# Patient Record
Sex: Female | Born: 1990
Health system: Southern US, Community
[De-identification: ages and names within clinical notes are randomized; demographics above are authoritative.]

## PROBLEM LIST (undated history)

## (undated) ENCOUNTER — Inpatient Hospital Stay: Payer: Self-pay

## (undated) DIAGNOSIS — R87619 Unspecified abnormal cytological findings in specimens from cervix uteri: Secondary | ICD-10-CM

## (undated) DIAGNOSIS — Z9289 Personal history of other medical treatment: Secondary | ICD-10-CM

## (undated) DIAGNOSIS — R12 Heartburn: Secondary | ICD-10-CM

## (undated) HISTORY — PX: TYMPANOSTOMY TUBE PLACEMENT: SHX32

## (undated) HISTORY — DX: Heartburn: R12

## (undated) HISTORY — DX: Personal history of other medical treatment: Z92.89

## (undated) HISTORY — PX: WISDOM TOOTH EXTRACTION: SHX21

## (undated) HISTORY — DX: Unspecified abnormal cytological findings in specimens from cervix uteri: R87.619

---
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
## 2012-06-14 ENCOUNTER — Emergency Department: Payer: Self-pay | Admitting: *Deleted
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
## 2013-12-04 DIAGNOSIS — R87619 Unspecified abnormal cytological findings in specimens from cervix uteri: Secondary | ICD-10-CM

## 2013-12-04 HISTORY — DX: Unspecified abnormal cytological findings in specimens from cervix uteri: R87.619

## 2014-06-21 ENCOUNTER — Inpatient Hospital Stay: Payer: Self-pay | Admitting: Obstetrics & Gynecology

## 2014-06-21 LAB — CBC WITH DIFFERENTIAL/PLATELET
BASOS ABS: 0.1 10*3/uL (ref 0.0–0.1)
Basophil %: 0.5 %
EOS PCT: 0.5 %
Eosinophil #: 0.1 10*3/uL (ref 0.0–0.7)
HCT: 35.4 % (ref 35.0–47.0)
HGB: 11.1 g/dL — ABNORMAL LOW (ref 12.0–16.0)
LYMPHS ABS: 1.8 10*3/uL (ref 1.0–3.6)
Lymphocyte %: 11.5 %
MCH: 26.5 pg (ref 26.0–34.0)
MCHC: 31.5 g/dL — AB (ref 32.0–36.0)
MCV: 84 fL (ref 80–100)
Monocyte #: 0.8 x10 3/mm (ref 0.2–0.9)
Monocyte %: 5.1 %
NEUTROS ABS: 12.9 10*3/uL — AB (ref 1.4–6.5)
NEUTROS PCT: 82.4 %
Platelet: 258 10*3/uL (ref 150–440)
RBC: 4.2 10*6/uL (ref 3.80–5.20)
RDW: 15.9 % — ABNORMAL HIGH (ref 11.5–14.5)
WBC: 15.6 10*3/uL — ABNORMAL HIGH (ref 3.6–11.0)

## 2014-06-21 LAB — GC/CHLAMYDIA PROBE AMP

## 2014-06-23 LAB — HEMATOCRIT: HCT: 28.3 % — AB (ref 35.0–47.0)

## 2015-03-09 NOTE — H&P (Signed)
L&D Evaluation:  History:  HPI CC: worsening UCs HPI: 24 y/o G1 @ 40/5 (LMP=17wk u/s), with above CC. Preg c/b GBS pos and ASCUS/HPV+ pap  No VB, LOF or decreased FM   Patient's Medical History No Chronic Illness   Patient's Surgical History none   Medications Pre Natal Vitamins   Allergies NKDA   Social History none   Family History Non-Contributory   Exam:  Vital Signs AF VS normal and stable   General no apparent distress   Mental Status clear   Chest CTAB   Heart RRR no MRGs   Abdomen gravid, non-tender   Estimated Fetal Weight Average for gestational age, 713700gm   Fetal Position cephalic   Pelvic 4/100 per RN   Mebranes Intact   FHT 145 baseline, +accels, no decels, mod var   Ucx q2-245m   Impression:  Impression likely transitioning to active labor   Plan:  Comments *IUP: category I tracing *term labor: likely transitioning to active labor. okay for intermittent monitoring. no need to augment and if need to, will wait until 2nd dose of abx is done. AROM vs pit d/w patient and she is amenable to either *GBS pos: will start amp *Analgesia: we discussed the various methods and patient to consider *Abnormal pap: rpt pap with co-testing in one year  O pos/RI/rpr pending/hiv neg/hepB neg/tdap UTD/ascus and HPV pos pap (2014)/varicella NI   Electronic Signatures: [place] BingPickens, Zeric Baranowski (MD)  (Signed 23-Aug-15 10:37)  Authored: L&D Evaluation   Last Updated: 23-Aug-15 10:37 by [place] BingPickens, Petrea Fredenburg (MD)

## 2015-11-15 LAB — HM PAP SMEAR: HM Pap smear: NEGATIVE

## 2017-05-08 ENCOUNTER — Ambulatory Visit: Payer: Self-pay | Admitting: Obstetrics and Gynecology

## 2017-07-10 ENCOUNTER — Ambulatory Visit: Payer: Self-pay | Admitting: Obstetrics and Gynecology

## 2017-10-30 NOTE — L&D Delivery Note (Signed)
Date of delivery: 06/22/2018 Estimated Date of Delivery: 06/25/18 Patient's last menstrual period was 09/11/2017. EGA: 2353w4d  Delivery Note At 3:07 PM a viable female was delivered via Vaginal, Spontaneous (Presentation: OA;  ROA with nuchal hand).  APGAR: 8, 9; weight 7 lb 6.2 oz (3350 g).   Placenta status: spontaneous, intact.  Cord:  with the following complications: none.  Cord pH: NA  Mom progressed well following 1 dose of cytotec given 25 mcg PV and 25 mcg buccal. She had SROM at 2:05 PM and was complete and pushing at 3:03 PM. She pushed to deliver a viable female infant at 3:07 PM.  The head followed by shoulders, which delivered without difficulty, and the rest of the body.  No nuchal cord noted.  Baby to mom's chest.  Cord clamped and cut after 4 min delay.  Cord blood obtained.  Placenta delivered spontaneously, intact, with a 3-vessel cord.  No lacerations.  All counts correct.  Hemostasis obtained with IV pitocin and fundal massage.     Anesthesia:  none Episiotomy: None Lacerations:  none Suture Repair: NA Est. Blood Loss (mL): 320  Mom to postpartum.  Baby to Couplet care / Skin to Skin.  Carolyn Wheeler, CNM 06/22/2018, 6:22 PM

## 2017-10-31 ENCOUNTER — Ambulatory Visit (INDEPENDENT_AMBULATORY_CARE_PROVIDER_SITE_OTHER): Payer: BLUE CROSS/BLUE SHIELD | Admitting: Obstetrics and Gynecology

## 2017-10-31 ENCOUNTER — Encounter: Payer: Self-pay | Admitting: Obstetrics and Gynecology

## 2017-10-31 VITALS — BP 118/70 | Wt 173.0 lb

## 2017-10-31 DIAGNOSIS — Z3480 Encounter for supervision of other normal pregnancy, unspecified trimester: Secondary | ICD-10-CM

## 2017-10-31 DIAGNOSIS — Z124 Encounter for screening for malignant neoplasm of cervix: Secondary | ICD-10-CM

## 2017-10-31 DIAGNOSIS — Z3A01 Less than 8 weeks gestation of pregnancy: Secondary | ICD-10-CM

## 2017-10-31 DIAGNOSIS — Z113 Encounter for screening for infections with a predominantly sexual mode of transmission: Secondary | ICD-10-CM

## 2017-10-31 LAB — OB RESULTS CONSOLE VARICELLA ZOSTER ANTIBODY, IGG: VARICELLA IGG: IMMUNE

## 2017-10-31 NOTE — Progress Notes (Signed)
New Obstetric Patient H&P   Chief Complaint: "Desires prenatal care"   History of Present Illness: Patient is a 27 y.o. G2P1001 Hispanic or Latino female, LMP 09/11/17 presents with amenorrhea and positive home pregnancy test. Based on her  LMP, her EDD is Estimated Date of Delivery: 06/18/18 and her EGA is 3741w1d. Cycles are 4. days, regular, and occur approximately every : 28 days. Her last pap smear was 2 years ago and was no abnormalities.    She had a urine pregnancy test which was positive 2 week(s)  ago. Her last menstrual period was normal and lasted for  4 day(s). Since her LMP she claims she has experienced nausea and vomiting and some mild dizzyness with eating. She denies vaginal bleeding. Her past medical history is noncontributory. Her prior pregnancies are notable for no complications.   Since her LMP, she admits to the use of tobacco products  no She claims she has gained zero pounds since the start of her pregnancy.  There are cats in the home in the home  no  She admits close contact with children on a regular basis  yes  She has had chicken pox in the past no She has had Tuberculosis exposures, symptoms, or previously tested positive for TB   no Current or past history of domestic violence. no  Genetic Screening/Teratology Counseling: (Includes patient, baby's father, or anyone in either family with:)   1. Patient's [facility]/= 2735 [facility]  no 2. Thalassemia ([place], [place], Mediterranean, or Asian background): MCV<80  no 3. Neural tube defect (meningomyelocele, spina bifida, anencephaly)  no 4. Congenital heart defect  no  5. Down syndrome  no 6. Tay-Sachs (Jewish, [place])  no 7. Canavan's Disease  no 8. Sickle cell disease or trait (African)  no  9. Hemophilia or other blood disorders  no  10. Muscular dystrophy  no  11. Cystic fibrosis  no  12. Huntington's Chorea  no  13. Mental retardation/autism  no 14. Other inherited genetic or chromosomal disorder   no 15. Maternal metabolic disorder (DM, PKU, etc)  no 16. Patient or FOB with a child with a birth defect not listed above no  16a. Patient or FOB with a birth defect themselves no 17. Recurrent pregnancy loss, or stillbirth  no  18. Any medications since LMP other than prenatal vitamins (include vitamins, supplements, OTC meds, drugs, alcohol)  no 19. Any other genetic/environmental exposure to discuss  no  Infection History:   1. Lives with someone with TB or TB exposed  no  2. Patient or partner has history of genital herpes  no 3. Rash or viral illness since LMP  no 4. History of STI (GC, CT, HPV, syphilis, HIV)  no 5. History of recent travel :  no  Other pertinent information:  no   Review of Systems:10 point review of systems negative unless otherwise noted in HPI  Past Medical History:  Diagnosis Date  . Abnormal Pap smear of cervix 12/04/2013   ascus - pos hpv  . History of Papanicolaou smear of cervix 12/04/2013; [id number]   ascus hpv +; neg -/-/-    Past Surgical History:  Procedure Laterality Date  . TYMPANOSTOMY TUBE PLACEMENT      Gynecologic History: Patient's last menstrual period was 09/11/2017.  Obstetric History: G2P1001, SVD without complication  History reviewed. No pertinent family history.  Social History   Socioeconomic History  . Marital status: Married    Spouse name: Not on file  .  Number of children: Not on file  . Years of education: 59  . Highest education level: Not on file  Social Needs  . Financial resource strain: Not on file  . Food insecurity - worry: Not on file  . Food insecurity - inability: Not on file  . Transportation needs - medical: Not on file  . Transportation needs - non-medical: Not on file  Occupational History  . Not on file  Tobacco Use  . Smoking status: Never Smoker  . Smokeless tobacco: Never Used  Substance and Sexual Activity  . Alcohol use: No    Frequency: Never    Comment: former  . Drug use: No   . Sexual activity: Yes    Birth control/protection: None  Other Topics Concern  . Not on file  Social History Narrative  . Not on file   Allergies: No Known Allergies  Prior to Admission medications   Medication Sig Start Date End Date Taking? Authorizing Provider  Prenatal Vit-Fe Fumarate-FA (MULTIVITAMIN-PRENATAL) 27-0.8 MG TABS tablet Take 1 tablet by mouth daily at 12 noon.   Yes [provider]    Physical Exam BP 118/70   Wt 173 lb (78.5 kg)   LMP 09/11/2017   BMI 29.70 kg/m   Physical Exam  Constitutional: She is oriented to person, place, and time. She appears well-developed and well-nourished. No distress.  HENT:  Head: Normocephalic and atraumatic.  Eyes: Conjunctivae are normal.  Neck: Normal range of motion. Neck supple. No thyromegaly present.  Cardiovascular: Normal rate, regular rhythm and normal heart sounds. Exam reveals no gallop and no friction rub.  No murmur heard. Pulmonary/Chest: Effort normal and breath sounds normal. She has no wheezes.  Abdominal: Soft. She exhibits no distension and no mass. There is no tenderness. There is no rebound and no guarding. No hernia. Hernia confirmed negative in the right inguinal area and confirmed negative in the left inguinal area.  Genitourinary: Vagina normal and uterus normal. Pelvic exam was performed with patient supine. There is no rash, tenderness or lesion on the right labia. There is no rash, tenderness or lesion on the left labia. Uterus is not tender. Cervix exhibits no motion tenderness and no discharge. Right adnexum displays no mass, no tenderness and no fullness. Left adnexum displays no mass, no tenderness and no fullness.  Musculoskeletal: Normal range of motion.  Lymphadenopathy:       Right: No inguinal adenopathy present.       Left: No inguinal adenopathy present.  Neurological: She is alert and oriented to person, place, and time.  Skin: Skin is warm and dry. No rash noted.  Psychiatric:  She has a normal mood and affect. Her behavior is normal.    Female Chaperone present during breast and/or pelvic exam.   Assessment: 27 y.o. G2P1001 at [redacted]w[redacted]d presenting to initiate prenatal care  Plan: 1) Avoid alcoholic beverages. 2) Patient encouraged not to smoke.  3) Discontinue the use of all non-medicinal drugs and chemicals.  4) Take prenatal vitamins daily.  5) Nutrition, food safety (fish, cheese advisories, and high nitrite foods) and exercise discussed. 6) Hospital and practice style discussed with cross coverage system.  7) Genetic Screening, such as with 1st Trimester Screening, cell free fetal DNA, AFP testing, and Ultrasound, as well as with amniocentesis and CVS as appropriate, is discussed with patient. At the conclusion of today's visit patient declined genetic testing 8) Patient is asked about travel to areas at risk for the Zika virus, and counseled to  avoid travel and exposure to mosquitoes or sexual partners who may have themselves been exposed to the virus. Testing is discussed, and will be ordered as appropriate.   Thomasene Mohair, MD 10/31/2017 10:41 AM

## 2017-10-31 NOTE — Patient Instructions (Signed)
Folic Acid (Folate): 800 - 1,6101,000 mcg or 1 mg

## 2017-11-02 LAB — RPR+RH+ABO+RUB AB+AB SCR+CB...
ANTIBODY SCREEN: NEGATIVE
HEMATOCRIT: 41.1 % (ref 34.0–46.6)
HEMOGLOBIN: 13.1 g/dL (ref 11.1–15.9)
HIV SCREEN 4TH GENERATION: NONREACTIVE
Hepatitis B Surface Ag: NEGATIVE
MCH: 28 pg (ref 26.6–33.0)
MCHC: 31.9 g/dL (ref 31.5–35.7)
MCV: 88 fL (ref 79–97)
Platelets: 393 10*3/uL — ABNORMAL HIGH (ref 150–379)
RBC: 4.68 x10E6/uL (ref 3.77–5.28)
RDW: 14.5 % (ref 12.3–15.4)
RPR: NONREACTIVE
Rh Factor: POSITIVE
Rubella Antibodies, IGG: 1.44 index (ref >0.99)
Varicella zoster IgG: 2712 index (ref >165)
WBC: 12.2 10*3/uL — AB (ref 3.4–10.8)

## 2017-11-02 LAB — HEMOGLOBINOPATHY EVALUATION
HGB A: 98 % (ref 96.4–98.8)
HGB C: 0 %
HGB S: 0 %
HGB VARIANT: 0 %
Hemoglobin A2 Quantitation: 2 % (ref 1.8–3.2)
Hemoglobin F Quantitation: 0 % (ref 0.0–2.0)

## 2017-11-02 LAB — IGP, CTNG, RFX APTIMA HPV ASCU
CHLAMYDIA, NUC. ACID AMP: NEGATIVE
GONOCOCCUS BY NUCLEIC ACID AMP: NEGATIVE
PAP Smear Comment: 0

## 2017-11-07 ENCOUNTER — Ambulatory Visit (INDEPENDENT_AMBULATORY_CARE_PROVIDER_SITE_OTHER): Payer: BLUE CROSS/BLUE SHIELD | Admitting: Obstetrics & Gynecology

## 2017-11-07 ENCOUNTER — Ambulatory Visit (INDEPENDENT_AMBULATORY_CARE_PROVIDER_SITE_OTHER): Payer: BLUE CROSS/BLUE SHIELD

## 2017-11-07 VITALS — BP 110/60 | Wt 176.0 lb

## 2017-11-07 DIAGNOSIS — Z3A01 Less than 8 weeks gestation of pregnancy: Secondary | ICD-10-CM

## 2017-11-07 DIAGNOSIS — Z3480 Encounter for supervision of other normal pregnancy, unspecified trimester: Secondary | ICD-10-CM

## 2017-11-07 DIAGNOSIS — Z362 Encounter for other antenatal screening follow-up: Secondary | ICD-10-CM

## 2017-11-07 NOTE — Progress Notes (Signed)
Review of ULTRASOUND.    [facility] personally [facility] images and [facility]    Plan of management to be discussed with patient.  EDC changed. Labs discussed. U Culture sent today Nausea mild, controlled w Carolyn DeistBonjesta (she takes PRN) Declines genetic testing  [facility] Prenatal Labs  [facility] Blood type: O/Positive/-- (01/02 1045)   Genetic Screen declines Antibody:Negative (01/02 1045)  Anatomic US WS Rubella: 1.44 (01/02 1045) Varicella: Immune  GTT  Third trimester:  RPR: Non Reactive (01/02 1045)   Rhogam n/a HBsAg: Negative (01/02 1045)   TDaP vaccine                       Flu Shot: HIV: Non Reactive (01/02 1045)   Baby Food breast                    GBS:   Contraception unsure Pap: normal 11/07/17  CBB     CS/VBAC no   Support Person spouse    Annamarie MajorPaul Phelan Goers, MD, Merlinda FrederickFACOG [facility], [facility] 11/07/2017  2:20 PM

## 2017-11-09 LAB — URINE CULTURE

## 2017-12-05 ENCOUNTER — Ambulatory Visit (INDEPENDENT_AMBULATORY_CARE_PROVIDER_SITE_OTHER): Payer: BLUE CROSS/BLUE SHIELD | Admitting: Maternal Newborn

## 2017-12-05 ENCOUNTER — Encounter: Payer: Self-pay | Admitting: Maternal Newborn

## 2017-12-05 VITALS — BP 110/70 | Wt 169.0 lb

## 2017-12-05 DIAGNOSIS — Z3480 Encounter for supervision of other normal pregnancy, unspecified trimester: Secondary | ICD-10-CM

## 2017-12-05 DIAGNOSIS — Z3A11 11 weeks gestation of pregnancy: Secondary | ICD-10-CM

## 2017-12-05 NOTE — Progress Notes (Signed)
    Routine Prenatal Care Visit  Subjective  Carolyn QuailJeanette S Bishara is a 27 y.o. G2P1001 at 5453w1d being seen today for ongoing prenatal care.  She is currently monitored for the following issues for this low-risk pregnancy and has Supervision of other normal pregnancy, antepartum on their problem list.  ----------------------------------------------------------------------------------- Patient reports nausea and vomiting. She feels that it is decreasing, and has some days where she can eat normally. Also has ptyalism. Tolerating liquids every day. Bonjesta helps relieve n/v but makes her very drowsy. Vag. Bleeding: None.  Denies leaking of fluid.  ----------------------------------------------------------------------------------- The following portions of the patient's history were reviewed and updated as appropriate: allergies, current medications, past family history, past medical history, past social history, past surgical history and problem list. Problem list updated.   Objective  Blood pressure 110/70, weight 169 lb (76.7 kg), last menstrual period 09/11/2017. Pregravid weight 173 lb (78.5 kg) Total Weight Gain  (-1.814 kg) Urinalysis: Urine Protein: 1+ Urine Glucose: Negative  Fetal Status: Fetal Heart Rate (bpm): 164         General:  Alert, oriented and cooperative. Patient is in no acute distress.  Skin: Skin is warm and dry. No rash noted.   Cardiovascular: Normal heart rate noted  Respiratory: Normal respiratory effort, no problems with respiration noted  Abdomen: Soft, gravid, appropriate for gestational age. Pain/Pressure: Absent     Pelvic:  Cervical exam deferred        Extremities: Normal range of motion.     Mental Status: Normal mood and affect. Normal behavior. Normal judgment and thought content.     Assessment   27 y.o. G2P1001 at 3753w1d, EDD 06/25/2018 by Ultrasound presenting for routine prenatal visit.  Plan   pregnancy Problems (from 10/31/17 to present)    Problem Noted Resolved   Supervision of other normal pregnancy, antepartum 10/31/2017 by Conard NovakJackson, Stephen D, MD No   Overview Addendum 11/07/2017  2:23 PM by Nadara MustardHarris, Robert P, MD    [facility] Blood type: O/Positive/-- (01/02 1045)   Genetic Screen declines Antibody:Negative (01/02 1045)  Anatomic US  Rubella: 1.44 (01/02 1045) Varicella: Imm  GTT  Third trimester:  RPR: Non Reactive (01/02 1045)   Rhogam n/a HBsAg: Negative (01/02 1045)   TDaP vaccine                       Flu Shot: HIV: Non Reactive (01/02 1045)   Baby Food breast                    GBS:   Contraception unsure Pap: normal 11/07/17  CBB     CS/VBAC no   Support Person spouse          Discussed foods to try, strategies to relieve nausea.  Preterm labor symptoms and general obstetric precautions including but not limited to vaginal bleeding were reviewed in detail with the patient.  Return in about 4 weeks (around 01/02/2018) for ROB.  Marcelyn BruinsJacelyn Magic Mohler, CNM 12/06/2017  8:18 AM

## 2017-12-05 NOTE — Progress Notes (Signed)
C/o hard to eat.rj

## 2018-01-01 ENCOUNTER — Ambulatory Visit (INDEPENDENT_AMBULATORY_CARE_PROVIDER_SITE_OTHER): Payer: BLUE CROSS/BLUE SHIELD | Admitting: Maternal Newborn

## 2018-01-01 ENCOUNTER — Encounter: Payer: Self-pay | Admitting: Maternal Newborn

## 2018-01-01 VITALS — BP 100/60 | Wt 170.0 lb

## 2018-01-01 DIAGNOSIS — Z3A15 15 weeks gestation of pregnancy: Secondary | ICD-10-CM

## 2018-01-01 DIAGNOSIS — Z3689 Encounter for other specified antenatal screening: Secondary | ICD-10-CM

## 2018-01-01 DIAGNOSIS — Z3480 Encounter for supervision of other normal pregnancy, unspecified trimester: Secondary | ICD-10-CM

## 2018-01-01 NOTE — Progress Notes (Signed)
No concerns.rj 

## 2018-01-01 NOTE — Patient Instructions (Signed)

## 2018-01-01 NOTE — Progress Notes (Signed)
    Routine Prenatal Care Visit  Subjective  Leda QuailJeanette S Bottcher is a 27 y.o. G2P1001 at 8788w0d being seen today for ongoing prenatal care.  She is currently monitored for the following issues for this low-risk pregnancy and has Supervision of other normal pregnancy, antepartum on their problem list.  ----------------------------------------------------------------------------------- Patient reports no complaints.  Nausea has improved and she is tolerating all foods except for pork. Vag. Bleeding: None.  Movement: Present. Denies leaking of fluid.  ----------------------------------------------------------------------------------- The following portions of the patient's history were reviewed and updated as appropriate: allergies, current medications, past family history, past medical history, past social history, past surgical history and problem list. Problem list updated.   Objective  Blood pressure 100/60, weight 170 lb (77.1 kg), last menstrual period 09/11/2017. Pregravid weight 173 lb (78.5 kg) Total Weight Gain  (-1.361 kg) Urinalysis: Urine Protein: Trace Urine Glucose: Negative  Fetal Status: Fetal Heart Rate (bpm): 149   Movement: Present     General:  Alert, oriented and cooperative. Patient is in no acute distress.  Skin: Skin is warm and dry. No rash noted.   Cardiovascular: Normal heart rate noted  Respiratory: Normal respiratory effort, no problems with respiration noted  Abdomen: Soft, gravid, appropriate for gestational age. Pain/Pressure: Absent     Pelvic:  Cervical exam deferred        Extremities: Normal range of motion.     Mental Status: Normal mood and affect. Normal behavior. Normal judgment and thought content.     Assessment   27 y.o. G2P1001 at 4288w0d, EDD 06/25/2018 by Ultrasound presenting for routine prenatal visit.  Plan   pregnancy Problems (from 10/31/17 to present)    Problem Noted Resolved   Supervision of other normal pregnancy, antepartum  10/31/2017 by Conard NovakJackson, Stephen D, MD No   Overview Addendum 11/07/2017  2:23 PM by Nadara MustardHarris, Robert P, MD    [facility] Blood type: O/Positive/-- (01/02 1045)   Genetic Screen declines Antibody:Negative (01/02 1045)  Anatomic US  Rubella: 1.44 (01/02 1045) Varicella: Imm  GTT  Third trimester:  RPR: Non Reactive (01/02 1045)   Rhogam n/a HBsAg: Negative (01/02 1045)   TDaP vaccine                       Flu Shot: HIV: Non Reactive (01/02 1045)   Baby Food breast                    GBS:   Contraception unsure Pap: normal 11/07/17  CBB     CS/VBAC no   Support Person spouse           Preterm labor symptoms and general obstetric precautions were reviewed with the patient.  Please refer to After Visit Summary for other counseling recommendations.   Return in about 4 weeks (around 01/29/2018) for ROB following ultrasound.  Marcelyn BruinsJacelyn Schmid, CNM 01/01/2018  9:10 AM

## 2018-01-04 ENCOUNTER — Other Ambulatory Visit: Payer: Self-pay | Admitting: Obstetrics and Gynecology

## 2018-01-04 ENCOUNTER — Telehealth: Payer: Self-pay | Admitting: Obstetrics and Gynecology

## 2018-01-04 ENCOUNTER — Other Ambulatory Visit: Payer: Self-pay

## 2018-01-04 DIAGNOSIS — Z20828 Contact with and (suspected) exposure to other viral communicable diseases: Secondary | ICD-10-CM

## 2018-01-04 MED ORDER — OSELTAMIVIR PHOSPHATE 75 MG PO CAPS
75.0000 mg | ORAL_CAPSULE | Freq: Every day | ORAL | 0 refills | Status: DC
Start: 2018-01-04 — End: 2018-04-22

## 2018-01-04 NOTE — Telephone Encounter (Signed)
Pt has been notified.

## 2018-01-04 NOTE — Telephone Encounter (Signed)
Lm with pt informing her RX sent in to pharm

## 2018-01-04 NOTE — Telephone Encounter (Signed)
Pt's son tested positive today for the flu and would like tamiflu sent [facility] to [facility] [place] today please.

## 2018-01-29 ENCOUNTER — Other Ambulatory Visit: Payer: BLUE CROSS/BLUE SHIELD

## 2018-01-29 ENCOUNTER — Encounter: Payer: BLUE CROSS/BLUE SHIELD | Admitting: Certified Nurse Midwife

## 2018-01-31 ENCOUNTER — Ambulatory Visit (INDEPENDENT_AMBULATORY_CARE_PROVIDER_SITE_OTHER): Payer: BLUE CROSS/BLUE SHIELD | Admitting: Obstetrics and Gynecology

## 2018-01-31 ENCOUNTER — Ambulatory Visit (INDEPENDENT_AMBULATORY_CARE_PROVIDER_SITE_OTHER): Payer: BLUE CROSS/BLUE SHIELD

## 2018-01-31 ENCOUNTER — Encounter: Payer: Self-pay | Admitting: Obstetrics and Gynecology

## 2018-01-31 VITALS — BP 114/70 | Wt 174.0 lb

## 2018-01-31 DIAGNOSIS — Z3689 Encounter for other specified antenatal screening: Secondary | ICD-10-CM

## 2018-01-31 DIAGNOSIS — Z3480 Encounter for supervision of other normal pregnancy, unspecified trimester: Secondary | ICD-10-CM

## 2018-01-31 DIAGNOSIS — Z3A19 19 weeks gestation of pregnancy: Secondary | ICD-10-CM

## 2018-01-31 NOTE — Progress Notes (Signed)
  Routine Prenatal Care Visit  Subjective  Carolyn Wheeler is a 27 y.o. G2P1001 at 6861w2d being seen today for ongoing prenatal care.  She is currently monitored for the following issues for this low-risk pregnancy and has Supervision of other normal pregnancy, antepartum on their problem list.  ----------------------------------------------------------------------------------- Patient reports no complaints.    . Vag. Bleeding: None.  Movement: Present. Denies leaking of fluid.  Anatomy scan incomplete with no abnormalities. Gender is a surprise.  ----------------------------------------------------------------------------------- The following portions of the patient's history were reviewed and updated as appropriate: allergies, current medications, past family history, past medical history, past social history, past surgical history and problem list. Problem list updated.  Objective  Blood pressure 114/70, weight 174 lb (78.9 kg), last menstrual period 09/11/2017. Pregravid weight 173 lb (78.5 kg) Total Weight Gain 1 lb (0.454 kg) Urinalysis: Urine Protein: Negative Urine Glucose: Negative  Fetal Status: Fetal Heart Rate (bpm): Present   Movement: Present     General:  Alert, oriented and cooperative. Patient is in no acute distress.  Skin: Skin is warm and dry. No rash noted.   Cardiovascular: Normal heart rate noted  Respiratory: Normal respiratory effort, no problems with respiration noted  Abdomen: Soft, gravid, appropriate for gestational age. Pain/Pressure: Absent     Pelvic:  Cervical exam deferred        Extremities: Normal range of motion.     Mental Status: Normal mood and affect. Normal behavior. Normal judgment and thought content.   Assessment   27 y.o. G2P1001 at 1461w2d by  06/25/2018, by Ultrasound presenting for routine prenatal visit  Plan   pregnancy Problems (from 10/31/17 to present)    Problem Noted Resolved   Supervision of other normal pregnancy, antepartum  10/31/2017 by Conard NovakJackson, [place] Cagley D, MD No   Overview Addendum 11/07/2017  2:23 PM by Nadara MustardHarris, Robert P, MD    [facility] Blood type: O/Positive/-- (01/02 1045)   Genetic Screen declines Antibody:Negative (01/02 1045)  Anatomic US  Rubella: 1.44 (01/02 1045) Varicella: Imm  GTT  Third trimester:  RPR: Non Reactive (01/02 1045)   Rhogam n/a HBsAg: Negative (01/02 1045)   TDaP vaccine                       Flu Shot: HIV: Non Reactive (01/02 1045)   Baby Food breast                    GBS:   Contraception unsure Pap: normal 11/07/17  CBB     CS/VBAC no   Support Person spouse            Preterm labor symptoms and general obstetric precautions including but not limited to vaginal bleeding, contractions, leaking of fluid and fetal movement were reviewed in detail with the patient. Please refer to After Visit Summary for other counseling recommendations.   Return in about 1 month (around 02/28/2018) for U/S for completion anatomy with routine prenatal after.  Thomasene MohairStephen Narely Nobles, MD, Merlinda FrederickFACOG [facility], [facility] 01/31/2018 10:42 AM

## 2018-01-31 NOTE — Progress Notes (Incomplete)
  Routine Prenatal Care Visit  Subjective  Carolyn QuailJeanette S Coia is a 27 y.o. G2P1001 at 8510w2d being seen today for ongoing prenatal care.  She is currently monitored for the following issues for this {Blank single:19197::"high-risk","low-risk"} pregnancy and has Supervision of other normal pregnancy, antepartum on their problem list.  ----------------------------------------------------------------------------------- Patient reports {sx:14538}.    . Vag. Bleeding: None.  Movement: Present. Denies leaking of fluid.  ----------------------------------------------------------------------------------- The following portions of the patient's history were reviewed and updated as appropriate: allergies, current medications, past family history, past medical history, past social history, past surgical history and problem list. Problem list updated.   Objective  Blood pressure 114/70, weight 174 lb (78.9 kg), last menstrual period 09/11/2017. Pregravid weight 173 lb (78.5 kg) Total Weight Gain 1 lb (0.454 kg) Urinalysis: Urine Protein: Negative Urine Glucose: Negative  Fetal Status: Fetal Heart Rate (bpm): Present   Movement: Present     General:  Alert, oriented and cooperative. Patient is in no acute distress.  Skin: Skin is warm and dry. No rash noted.   Cardiovascular: Normal heart rate noted  Respiratory: Normal respiratory effort, no problems with respiration noted  Abdomen: Soft, gravid, appropriate for gestational age. Pain/Pressure: Absent     Pelvic:  {Blank single:19197::"Cervical exam performed","Cervical exam deferred"}        Extremities: Normal range of motion.     Mental Status: Normal mood and affect. Normal behavior. Normal judgment and thought content.   Assessment   27 y.o. G2P1001 at 6710w2d by  06/25/2018, by Ultrasound presenting for {Blank single:19197::"routine","work-in"} prenatal visit  Plan   pregnancy Problems (from 10/31/17 to present)    Problem Noted Resolved   Supervision of other normal pregnancy, antepartum 10/31/2017 by Conard NovakJackson, Ryenn Howeth D, MD No   Overview Addendum 11/07/2017  2:23 PM by Nadara MustardHarris, Robert P, MD    [facility] Blood type: O/Positive/-- (01/02 1045)   Genetic Screen declines Antibody:Negative (01/02 1045)  Anatomic US  Rubella: 1.44 (01/02 1045) Varicella: Imm  GTT  Third trimester:  RPR: Non Reactive (01/02 1045)   Rhogam n/a HBsAg: Negative (01/02 1045)   TDaP vaccine                       Flu Shot: HIV: Non Reactive (01/02 1045)   Baby Food breast                    GBS:   Contraception unsure Pap: normal 11/07/17  CBB     CS/VBAC no   Support Person spouse             {Blank single:19197::"Term","Preterm"} labor symptoms and general obstetric precautions including but not limited to vaginal bleeding, contractions, leaking of fluid and fetal movement were reviewed in detail with the patient. Please refer to After Visit Summary for other counseling recommendations.   Return in about 1 month (around 02/28/2018) for U/S for completion anatomy with routine prenatal after.  Thomasene MohairStephen Jamesa Tedrick, MD, Merlinda FrederickFACOG [facility], [facility] 01/31/2018 10:42 AM

## 2018-03-01 ENCOUNTER — Ambulatory Visit (INDEPENDENT_AMBULATORY_CARE_PROVIDER_SITE_OTHER): Payer: BLUE CROSS/BLUE SHIELD

## 2018-03-01 ENCOUNTER — Encounter: Payer: Self-pay | Admitting: Maternal Newborn

## 2018-03-01 ENCOUNTER — Ambulatory Visit (INDEPENDENT_AMBULATORY_CARE_PROVIDER_SITE_OTHER): Payer: BLUE CROSS/BLUE SHIELD | Admitting: Maternal Newborn

## 2018-03-01 VITALS — BP 100/60 | Wt 179.0 lb

## 2018-03-01 DIAGNOSIS — Z3480 Encounter for supervision of other normal pregnancy, unspecified trimester: Secondary | ICD-10-CM

## 2018-03-01 DIAGNOSIS — Z3A23 23 weeks gestation of pregnancy: Secondary | ICD-10-CM | POA: Diagnosis not present

## 2018-03-01 NOTE — Progress Notes (Signed)
No concerns.rj 

## 2018-03-01 NOTE — Progress Notes (Signed)
    Routine Prenatal Care Visit  Subjective  Carolyn Wheeler is a 27 y.o. G2P1001 at 106w3d being seen today for ongoing prenatal care.  She is currently monitored for the following issues for this low-risk pregnancy and has Supervision of other normal pregnancy, antepartum on their problem list.  ----------------------------------------------------------------------------------- Patient reports no complaints.   Contractions: Not present. Vag. Bleeding: None.  Movement: Present. Denies leaking of fluid.  ----------------------------------------------------------------------------------- The following portions of the patient's history were reviewed and updated as appropriate: allergies, current medications, past family history, past medical history, past social history, past surgical history and problem list. Problem list updated.   Objective  Blood pressure 100/60, weight 179 lb (81.2 kg), last menstrual period 09/11/2017. Pregravid weight 173 lb (78.5 kg) Total Weight Gain 6 lb (2.722 kg) Urinalysis: Urine Protein: Trace Urine Glucose: Negative  Fetal Status: Fetal Heart Rate (bpm): 142   Movement: Present     General:  Alert, oriented and cooperative. Patient is in no acute distress.  Skin: Skin is warm and dry. No rash noted.   Cardiovascular: Normal heart rate noted  Respiratory: Normal respiratory effort, no problems with respiration noted  Abdomen: Soft, gravid, appropriate for gestational age. Pain/Pressure: Absent     Pelvic:  Cervical exam deferred        Extremities: Normal range of motion.  Edema: None  Mental Status: Normal mood and affect. Normal behavior. Normal judgment and thought content.     Assessment   27 y.o. G2P1001 at [redacted]w[redacted]d, EDD 06/25/2018 by Ultrasound presenting for routine prenatal visit.  Plan   pregnancy Problems (from 10/31/17 to present)    Problem Noted Resolved   Supervision of other normal pregnancy, antepartum 10/31/2017 by Conard Novak,  MD No   Overview Addendum 11/07/2017  2:23 PM by Nadara Mustard, MD    Dating [place] [facility] 06/25/18 Blood type: O/Positive/-- (01/02 1045)   Genetic Screen declines Antibody:Negative (01/02 1045)  Anatomic [place]  Rubella: 1.44 (01/02 1045) Varicella: Imm  GTT  Third trimester:  RPR: Non Reactive (01/02 1045)   Rhogam n/a HBsAg: Negative (01/02 1045)   TDaP vaccine                       Flu Shot: HIV: Non Reactive (01/02 1045)   Baby Food breast                    GBS:   Contraception unsure Pap: normal 11/07/17  CBB     CS/VBAC no   Support Person spouse          Anatomy scan complete and normal today.  Gestational age appropriate obstetric precautions including but not limited to vaginal bleeding, contractions, leaking of fluid and fetal movement were reviewed.  Return in about 1 month (around 03/29/2018) for ROB with GTT/28 week labs.  Marcelyn Bruins, CNM 03/01/2018  1:34 PM

## 2018-04-01 ENCOUNTER — Encounter: Payer: Self-pay | Admitting: Obstetrics and Gynecology

## 2018-04-01 ENCOUNTER — Ambulatory Visit (INDEPENDENT_AMBULATORY_CARE_PROVIDER_SITE_OTHER): Payer: BLUE CROSS/BLUE SHIELD | Admitting: Obstetrics and Gynecology

## 2018-04-01 ENCOUNTER — Other Ambulatory Visit: Payer: BLUE CROSS/BLUE SHIELD

## 2018-04-01 VITALS — BP 100/60 | Wt 183.0 lb

## 2018-04-01 DIAGNOSIS — Z3480 Encounter for supervision of other normal pregnancy, unspecified trimester: Secondary | ICD-10-CM | POA: Diagnosis not present

## 2018-04-01 DIAGNOSIS — Z3A27 27 weeks gestation of pregnancy: Secondary | ICD-10-CM

## 2018-04-01 DIAGNOSIS — Z3482 Encounter for supervision of other normal pregnancy, second trimester: Secondary | ICD-10-CM

## 2018-04-01 NOTE — Progress Notes (Incomplete)
  Routine Prenatal Care Visit  Subjective  Carolyn QuailJeanette S Scronce is a 27 y.o. G2P1001 at 3059w6d being seen today for ongoing prenatal care.  She is currently monitored for the following issues for this {Blank single:19197::"high-risk","low-risk"} pregnancy and has Supervision of other normal pregnancy, antepartum on their problem list.  ----------------------------------------------------------------------------------- Patient reports {sx:14538}.   Contractions: Not present. Vag. Bleeding: None.  Movement: Present. Denies leaking of fluid.  ----------------------------------------------------------------------------------- The following portions of the patient's history were reviewed and updated as appropriate: allergies, current medications, past family history, past medical history, past social history, past surgical history and problem list. Problem list updated.   Objective  Blood pressure 100/60, weight 183 lb (83 kg), last menstrual period 09/11/2017. Pregravid weight 173 lb (78.5 kg) Total Weight Gain 10 lb (4.536 kg) Urinalysis:      Fetal Status: Fetal Heart Rate (bpm): 145 Fundal Height: 28 cm Movement: Present     General:  Alert, oriented and cooperative. Patient is in no acute distress.  Skin: Skin is warm and dry. No rash noted.   Cardiovascular: Normal heart rate noted  Respiratory: Normal respiratory effort, no problems with respiration noted  Abdomen: Soft, gravid, appropriate for gestational age. Pain/Pressure: Absent     Pelvic:  {Blank single:19197::"Cervical exam performed","Cervical exam deferred"}        Extremities: Normal range of motion.     Mental Status: Normal mood and affect. Normal behavior. Normal judgment and thought content.   Assessment   27 y.o. G2P1001 at 3959w6d by  06/25/2018, by Ultrasound presenting for {Blank single:19197::"routine","work-in"} prenatal visit  Plan   pregnancy Problems (from 10/31/17 to present)    Problem Noted Resolved   Supervision of other normal pregnancy, antepartum 10/31/2017 by Conard NovakJackson, Quentyn Kolbeck D, MD No   Overview Addendum 11/07/2017  2:23 PM by Nadara MustardHarris, Robert P, MD    [facility] Blood type: O/Positive/-- (01/02 1045)   Genetic Screen declines Antibody:Negative (01/02 1045)  Anatomic US  Rubella: 1.44 (01/02 1045) Varicella: Imm  GTT  Third trimester:  RPR: Non Reactive (01/02 1045)   Rhogam n/a HBsAg: Negative (01/02 1045)   TDaP vaccine                       Flu Shot: HIV: Non Reactive (01/02 1045)   Baby Food breast                    GBS:   Contraception unsure Pap: normal 11/07/17  CBB     CS/VBAC no   Support Person spouse             {Blank single:19197::"Term","Preterm"} labor symptoms and general obstetric precautions including but not limited to vaginal bleeding, contractions, leaking of fluid and fetal movement were reviewed in detail with the patient. Please refer to After Visit Summary for other counseling recommendations.   Return in about 2 weeks (around 04/15/2018) for Routine Prenatal Appointment.  Thomasene MohairStephen Kyiah Canepa, MD, Merlinda FrederickFACOG [facility], [facility] 04/01/2018 10:38 AM

## 2018-04-01 NOTE — Progress Notes (Signed)
  Routine Prenatal Care Visit  Subjective  Carolyn Wheeler is a 27 y.o. G2P1001 at 6849w6d being seen today for ongoing prenatal care.  She is currently monitored for the following issues for this low-risk pregnancy and has Supervision of other normal pregnancy, antepartum on their problem list.  ----------------------------------------------------------------------------------- Patient reports no complaints.   Contractions: Not present. Vag. Bleeding: None.  Movement: Present. Denies leaking of fluid.  ----------------------------------------------------------------------------------- The following portions of the patient's history were reviewed and updated as appropriate: allergies, current medications, past family history, past medical history, past social history, past surgical history and problem list. Problem list updated.   Objective  Blood pressure 100/60, weight 183 lb (83 kg), last menstrual period 09/11/2017. Pregravid weight 173 lb (78.5 kg) Total Weight Gain 10 lb (4.536 kg) Urinalysis:      Fetal Status: Fetal Heart Rate (bpm): 145 Fundal Height: 28 cm Movement: Present     General:  Alert, oriented and cooperative. Patient is in no acute distress.  Skin: Skin is warm and dry. No rash noted.   Cardiovascular: Normal heart rate noted  Respiratory: Normal respiratory effort, no problems with respiration noted  Abdomen: Soft, gravid, appropriate for gestational age. Pain/Pressure: Absent     Pelvic:  Cervical exam deferred        Extremities: Normal range of motion.     Mental Status: Normal mood and affect. Normal behavior. Normal judgment and thought content.   Assessment   27 y.o. G2P1001 at 1949w6d by  06/25/2018, by Ultrasound presenting for routine prenatal visit  Plan   pregnancy Problems (from 10/31/17 to present)    Problem Noted Resolved   Supervision of other normal pregnancy, antepartum 10/31/2017 by Conard NovakJackson, Tommaso Cavitt D, MD No   Overview Addendum 11/07/2017  2:23  PM by Nadara MustardHarris, Robert P, MD    [facility] Blood type: O/Positive/-- (01/02 1045)   Genetic Screen declines Antibody:Negative (01/02 1045)  Anatomic US  Rubella: 1.44 (01/02 1045) Varicella: Imm  GTT  Third trimester:  RPR: Non Reactive (01/02 1045)   Rhogam n/a HBsAg: Negative (01/02 1045)   TDaP vaccine                       Flu Shot: HIV: Non Reactive (01/02 1045)   Baby Food breast                    GBS:   Contraception unsure Pap: normal 11/07/17  CBB     CS/VBAC no   Support Person spouse             Preterm labor symptoms and general obstetric precautions including but not limited to vaginal bleeding, contractions, leaking of fluid and fetal movement were reviewed in detail with the patient. Please refer to After Visit Summary for other counseling recommendations.   28 week labs today  Return in about 2 weeks (around 04/15/2018) for Routine Prenatal Appointment.  Thomasene MohairStephen Whit Bruni, MD, Merlinda FrederickFACOG [facility], [facility] 04/01/2018 10:38 AM

## 2018-04-01 NOTE — Progress Notes (Incomplete)
  Routine Prenatal Care Visit  Subjective  Carolyn Wheeler is a 27 y.o. G2P1001 at [redacted]w[redacted]d being seen today for ongoing prenatal care.  She is currently monitored for the following issues for this {Blank single:19197::"high-risk","low-risk"} pregnancy and has Supervision of other normal pregnancy, antepartum on their problem list.  ----------------------------------------------------------------------------------- Patient reports {sx:14538}.   Contractions: Not present. Vag. Bleeding: None.  Movement: Present. Denies leaking of fluid.  ----------------------------------------------------------------------------------- The following portions of the patient's history were reviewed and updated as appropriate: allergies, current medications, past family history, past medical history, past social history, past surgical history and problem list. Problem list updated.   Objective  Blood pressure 100/60, weight 183 lb (83 kg), last menstrual period 09/11/2017. Pregravid weight 173 lb (78.5 kg) Total Weight Gain 10 lb (4.536 kg) Urinalysis:      Fetal Status: Fetal Heart Rate (bpm): 145 Fundal Height: 28 cm Movement: Present     General:  Alert, oriented and cooperative. Patient is in no acute distress.  Skin: Skin is warm and dry. No rash noted.   Cardiovascular: Normal heart rate noted  Respiratory: Normal respiratory effort, no problems with respiration noted  Abdomen: Soft, gravid, appropriate for gestational age. Pain/Pressure: Absent     Pelvic:  {Blank single:19197::"Cervical exam performed","Cervical exam deferred"}        Extremities: Normal range of motion.     Mental Status: Normal mood and affect. Normal behavior. Normal judgment and thought content.   Assessment   27 y.o. G2P1001 at [redacted]w[redacted]d by  06/25/2018, by Ultrasound presenting for {Blank single:19197::"routine","work-in"} prenatal visit  Plan   pregnancy Problems (from 10/31/17 to present)    Problem Noted Resolved   Supervision of other normal pregnancy, antepartum 10/31/2017 by Keene Gilkey D, MD No   Overview Addendum 11/07/2017  2:23 PM by Harris, Robert P, MD    Dating US EDC 06/25/18 Blood type: O/Positive/-- (01/02 1045)   Genetic Screen declines Antibody:Negative (01/02 1045)  Anatomic US  Rubella: 1.44 (01/02 1045) Varicella: Imm  GTT  Third trimester:  RPR: Non Reactive (01/02 1045)   Rhogam n/a HBsAg: Negative (01/02 1045)   TDaP vaccine                       Flu Shot: HIV: Non Reactive (01/02 1045)   Baby Food breast                    GBS:   Contraception unsure Pap: normal 11/07/17  CBB     CS/VBAC no   Support Person spouse             {Blank single:19197::"Term","Preterm"} labor symptoms and general obstetric precautions including but not limited to vaginal bleeding, contractions, leaking of fluid and fetal movement were reviewed in detail with the patient. Please refer to After Visit Summary for other counseling recommendations.   Return in about 2 weeks (around 04/15/2018) for Routine Prenatal Appointment.  Lissie Hinesley, MD, FACOG [facility], [facility] 04/01/2018 10:38 AM   

## 2018-04-02 LAB — 28 WEEK RH+PANEL
BASOS ABS: 0 10*3/uL (ref 0.0–0.2)
BASOS: 0 %
EOS (ABSOLUTE): 0.1 10*3/uL (ref 0.0–0.4)
EOS: 1 %
Gestational Diabetes Screen: 101 mg/dL (ref 65–139)
HEMOGLOBIN: 11.3 g/dL (ref 11.1–15.9)
HIV SCREEN 4TH GENERATION: NONREACTIVE
Hematocrit: 36.7 % (ref 34.0–46.6)
Immature Grans (Abs): 0 10*3/uL (ref 0.0–0.1)
Immature Granulocytes: 0 %
LYMPHS ABS: 1.9 10*3/uL (ref 0.7–3.1)
Lymphs: 16 %
MCH: 27.7 pg (ref 26.6–33.0)
MCHC: 30.8 g/dL — ABNORMAL LOW (ref 31.5–35.7)
MCV: 90 fL (ref 79–97)
MONOCYTES: 6 %
Monocytes Absolute: 0.7 10*3/uL (ref 0.1–0.9)
NEUTROS ABS: 9 10*3/uL — AB (ref 1.4–7.0)
Neutrophils: 77 %
Platelets: 293 10*3/uL (ref 150–450)
RBC: 4.08 x10E6/uL (ref 3.77–5.28)
RDW: 14.3 % (ref 12.3–15.4)
RPR Ser Ql: NONREACTIVE
WBC: 11.7 10*3/uL — AB (ref 3.4–10.8)

## 2018-04-22 ENCOUNTER — Encounter: Payer: Self-pay | Admitting: Certified Nurse Midwife

## 2018-04-22 ENCOUNTER — Ambulatory Visit (INDEPENDENT_AMBULATORY_CARE_PROVIDER_SITE_OTHER): Payer: BLUE CROSS/BLUE SHIELD | Admitting: Certified Nurse Midwife

## 2018-04-22 VITALS — BP 102/62 | Wt 184.0 lb

## 2018-04-22 DIAGNOSIS — Z3A30 30 weeks gestation of pregnancy: Secondary | ICD-10-CM | POA: Diagnosis not present

## 2018-04-22 DIAGNOSIS — Z23 Encounter for immunization: Secondary | ICD-10-CM | POA: Diagnosis not present

## 2018-04-22 DIAGNOSIS — Z3480 Encounter for supervision of other normal pregnancy, unspecified trimester: Secondary | ICD-10-CM

## 2018-04-22 DIAGNOSIS — Z3483 Encounter for supervision of other normal pregnancy, third trimester: Secondary | ICD-10-CM

## 2018-04-22 NOTE — Progress Notes (Signed)
ROB at 30wk6d: Doing well. Baby active. Discussed benefits to baby for receiving TDAP. Signed BT consent form after discussion Had some lower abdominal pain intermittently x 1 hour last week. No dysuria. Denies contractions Given handout on postpartum contraception  ROB in 2 weeks  Farrel Connersolleen Brissa Asante, [place]

## 2018-04-22 NOTE — Progress Notes (Signed)
Pt reports no problems. Would like to discuss tdap and blood consent.

## 2018-05-06 ENCOUNTER — Ambulatory Visit (INDEPENDENT_AMBULATORY_CARE_PROVIDER_SITE_OTHER): Payer: BLUE CROSS/BLUE SHIELD | Admitting: Certified Nurse Midwife

## 2018-05-06 VITALS — BP 100/60 | Wt 185.0 lb

## 2018-05-06 DIAGNOSIS — Z3A32 32 weeks gestation of pregnancy: Secondary | ICD-10-CM

## 2018-05-06 DIAGNOSIS — Z3480 Encounter for supervision of other normal pregnancy, unspecified trimester: Secondary | ICD-10-CM

## 2018-05-06 NOTE — Progress Notes (Signed)
C/o round ligament pain since last visit. Hurts to walk.

## 2018-05-06 NOTE — Progress Notes (Signed)
ROB at 32wk6 days: complains of LLQ pain. Relieved with heat and rest at night. Has ordered a maternity belt for support Baby active. FHT 140.  FKC instructions. Preregistration discussed. ROB in 2 weeks. Farrel Connersolleen Aanika Defoor, CNM

## 2018-05-22 ENCOUNTER — Ambulatory Visit (INDEPENDENT_AMBULATORY_CARE_PROVIDER_SITE_OTHER): Payer: BLUE CROSS/BLUE SHIELD | Admitting: Certified Nurse Midwife

## 2018-05-22 VITALS — BP 110/66 | Wt 191.0 lb

## 2018-05-22 DIAGNOSIS — Z3A35 35 weeks gestation of pregnancy: Secondary | ICD-10-CM

## 2018-05-22 DIAGNOSIS — Z3480 Encounter for supervision of other normal pregnancy, unspecified trimester: Secondary | ICD-10-CM

## 2018-05-22 NOTE — Progress Notes (Signed)
ROB at 35wk1d: Complains of some increasing discomforts of pregnancy including swelling in lower extremities  Good FM.  Exam: 6# weight gain in 2 weeks, but TWG 18#. FHR 140, cephalic presentation. BP 110/66. +1 non pitting edema of LE and feet Can use compression stockings for edema.  ROB in 1 weeks GBS at that time.

## 2018-05-22 NOTE — Progress Notes (Signed)
C/o swelling in feet/ankles.rj

## 2018-05-29 ENCOUNTER — Other Ambulatory Visit (HOSPITAL_COMMUNITY)
Admission: RE | Admit: 2018-05-29 | Discharge: 2018-05-29 | Disposition: A | Discharge status: Home / Self Care | Payer: BLUE CROSS/BLUE SHIELD | Source: Ambulatory Visit | Attending: Advanced Practice Midwife | Admitting: Advanced Practice Midwife

## 2018-05-29 ENCOUNTER — Ambulatory Visit (INDEPENDENT_AMBULATORY_CARE_PROVIDER_SITE_OTHER): Payer: BLUE CROSS/BLUE SHIELD | Admitting: Advanced Practice Midwife

## 2018-05-29 ENCOUNTER — Encounter: Payer: Self-pay | Admitting: Advanced Practice Midwife

## 2018-05-29 VITALS — BP 102/64 | Wt 196.0 lb

## 2018-05-29 DIAGNOSIS — Z3685 Encounter for antenatal screening for Streptococcus B: Secondary | ICD-10-CM | POA: Diagnosis not present

## 2018-05-29 DIAGNOSIS — Z113 Encounter for screening for infections with a predominantly sexual mode of transmission: Secondary | ICD-10-CM | POA: Diagnosis present

## 2018-05-29 DIAGNOSIS — Z3A36 36 weeks gestation of pregnancy: Secondary | ICD-10-CM

## 2018-05-29 DIAGNOSIS — Z3483 Encounter for supervision of other normal pregnancy, third trimester: Secondary | ICD-10-CM

## 2018-05-29 LAB — OB RESULTS CONSOLE GC/CHLAMYDIA: Gonorrhea: NEGATIVE

## 2018-05-29 NOTE — Patient Instructions (Signed)
Vaginal Delivery Vaginal delivery means that you will give birth by pushing your baby out of your birth canal (vagina). A team of health care providers will help you before, during, and after vaginal delivery. Birth experiences are unique for every woman and every pregnancy, and birth experiences vary depending on where you choose to give birth. What should I do to prepare for my baby's birth? Before your baby is born, it is important to talk with your health care provider about:  Your labor and delivery preferences. These may include: ? Medicines that you may be given. ? How you will manage your pain. This might include non-medical pain relief techniques or injectable pain relief such as epidural analgesia. ? How you and your baby will be monitored during labor and delivery. ? Who may be in the labor and delivery room with you. ? Your feelings about surgical delivery of your baby (cesarean delivery, or C-section) if this becomes necessary. ? Your feelings about receiving donated blood through an IV tube (blood transfusion) if this becomes necessary.  Whether you are able: ? To take pictures or videos of the birth. ? To eat during labor and delivery. ? To move around, walk, or change positions during labor and delivery.  What to expect after your baby is born, such as: ? Whether delayed umbilical cord clamping and cutting is offered. ? Who will care for your baby right after birth. ? Medicines or tests that may be recommended for your baby. ? Whether breastfeeding is supported in your hospital or birth center. ? How long you will be in the hospital or birth center.  How any medical conditions you have may affect your baby or your labor and delivery experience.  To prepare for your baby's birth, you should also:  Attend all of your health care visits before delivery (prenatal visits) as recommended by your health care provider. This is important.  Prepare your home for your baby's  arrival. Make sure that you have: ? Diapers. ? Baby clothing. ? Feeding equipment. ? Safe sleeping arrangements for you and your baby.  Install a car seat in your vehicle. Have your car seat checked by a certified car seat installer to make sure that it is installed safely.  Think about who will help you with your new baby at home for at least the first several weeks after delivery.  What can I expect when I arrive at the birth center or hospital? Once you are in labor and have been admitted into the hospital or birth center, your health care provider may:  Review your pregnancy history and any concerns you have.  Insert an IV tube into one of your veins. This is used to give you fluids and medicines.  Check your blood pressure, pulse, temperature, and heart rate (vital signs).  Check whether your bag of water (amniotic sac) has broken (ruptured).  Talk with you about your birth plan and discuss pain control options.  Monitoring Your health care provider may monitor your contractions (uterine monitoring) and your baby's heart rate (fetal monitoring). You may need to be monitored:  Often, but not continuously (intermittently).  All the time or for long periods at a time (continuously). Continuous monitoring may be needed if: ? You are taking certain medicines, such as medicine to relieve pain or make your contractions stronger. ? You have pregnancy or labor complications.  Monitoring may be done by:  Placing a special stethoscope or a handheld monitoring device on your abdomen to   check your baby's heartbeat, and feeling your abdomen for contractions. This method of monitoring does not continuously record your baby's heartbeat or your contractions.  Placing monitors on your abdomen (external monitors) to record your baby's heartbeat and the frequency and length of contractions. You may not have to wear external monitors all the time.  Placing monitors inside of your uterus  (internal monitors) to record your baby's heartbeat and the frequency, length, and strength of your contractions. ? Your health care provider may use internal monitors if he or she needs more information about the strength of your contractions or your baby's heart rate. ? Internal monitors are put in place by passing a thin, flexible wire through your vagina and into your uterus. Depending on the type of monitor, it may remain in your uterus or on your baby's head until birth. ? Your health care provider will discuss the benefits and risks of internal monitoring with you and will ask for your permission before inserting the monitors.  Telemetry. This is a type of continuous monitoring that can be done with external or internal monitors. Instead of having to stay in bed, you are able to move around during telemetry. Ask your health care provider if telemetry is an option for you.  Physical exam Your health care provider may perform a physical exam. This may include:  Checking whether your baby is positioned: ? With the head toward your vagina (head-down). This is most common. ? With the head toward the top of your uterus (head-up or breech). If your baby is in a breech position, your health care provider may try to turn your baby to a head-down position so you can deliver vaginally. If it does not seem that your baby can be born vaginally, your provider may recommend surgery to deliver your baby. In rare cases, you may be able to deliver vaginally if your baby is head-up (breech delivery). ? Lying sideways (transverse). Babies that are lying sideways cannot be delivered vaginally.  Checking your cervix to determine: ? Whether it is thinning out (effacing). ? Whether it is opening up (dilating). ? How low your baby has moved into your birth canal.  What are the three stages of labor and delivery?  Normal labor and delivery is divided into the following three stages: Stage 1  Stage 1 is the  longest stage of labor, and it can last for hours or days. Stage 1 includes: ? Early labor. This is when contractions may be irregular, or regular and mild. Generally, early labor contractions are more than 10 minutes apart. ? Active labor. This is when contractions get longer, more regular, more frequent, and more intense. ? The transition phase. This is when contractions happen very close together, are very intense, and may last longer than during any other part of labor.  Contractions generally feel mild, infrequent, and irregular at first. They get stronger, more frequent (about every 2-3 minutes), and more regular as you progress from early labor through active labor and transition.  Many women progress through stage 1 naturally, but you may need help to continue making progress. If this happens, your health care provider may talk with you about: ? Rupturing your amniotic sac if it has not ruptured yet. ? Giving you medicine to help make your contractions stronger and more frequent.  Stage 1 ends when your cervix is completely dilated to 4 inches (10 cm) and completely effaced. This happens at the end of the transition phase. Stage 2  Once   your cervix is completely effaced and dilated to 4 inches (10 cm), you may start to feel an urge to push. It is common for the body to naturally take a rest before feeling the urge to push, especially if you received an epidural or certain other pain medicines. This rest period may last for up to 1-2 hours, depending on your unique labor experience.  During stage 2, contractions are generally less painful, because pushing helps relieve contraction pain. Instead of contraction pain, you may feel stretching and burning pain, especially when the widest part of your baby's head passes through the vaginal opening (crowning).  Your health care provider will closely monitor your pushing progress and your baby's progress through the vagina during stage 2.  Your  health care provider may massage the area of skin between your vaginal opening and anus (perineum) or apply warm compresses to your perineum. This helps it stretch as the baby's head starts to crown, which can help prevent perineal tearing. ? In some cases, an incision may be made in your perineum (episiotomy) to allow the baby to pass through the vaginal opening. An episiotomy helps to make the opening of the vagina larger to allow more room for the baby to fit through.  It is very important to breathe and focus so your health care provider can control the delivery of your baby's head. Your health care provider may have you decrease the intensity of your pushing, to help prevent perineal tearing.  After delivery of your baby's head, the shoulders and the rest of the body generally deliver very quickly and without difficulty.  Once your baby is delivered, the umbilical cord may be cut right away, or this may be delayed for 1-2 minutes, depending on your baby's health. This may vary among health care providers, hospitals, and birth centers.  If you and your baby are healthy enough, your baby may be placed on your chest or abdomen to help maintain the baby's temperature and to help you bond with each other. Some mothers and babies start breastfeeding at this time. Your health care team will dry your baby and help keep your baby warm during this time.  Your baby may need immediate care if he or she: ? Showed signs of distress during labor. ? Has a medical condition. ? Was born too early (prematurely). ? Had a bowel movement before birth (meconium). ? Shows signs of difficulty transitioning from being inside the uterus to being outside of the uterus. If you are planning to breastfeed, your health care team will help you begin a feeding. Stage 3  The third stage of labor starts immediately after the birth of your baby and ends after you deliver the placenta. The placenta is an organ that develops  during pregnancy to provide oxygen and nutrients to your baby in the womb.  Delivering the placenta may require some pushing, and you may have mild contractions. Breastfeeding can stimulate contractions to help you deliver the placenta.  After the placenta is delivered, your uterus should tighten (contract) and become firm. This helps to stop bleeding in your uterus. To help your uterus contract and to control bleeding, your health care provider may: ? Give you medicine by injection, through an IV tube, by mouth, or through your rectum (rectally). ? Massage your abdomen or perform a vaginal exam to remove any blood clots that are left in your uterus. ? Empty your bladder by placing a thin, flexible tube (catheter) into your bladder. ? Encourage   you to breastfeed your baby. After labor is over, you and your baby will be monitored closely to ensure that you are both healthy until you are ready to go home. Your health care team will teach you how to care for yourself and your baby. This information is not intended to replace advice given to you by your health care provider. Make sure you discuss any questions you have with your health care provider. Document Released: 07/25/2008 Document Revised: 05/05/2016 Document Reviewed: 10/31/2015 Elsevier Interactive Patient Education  2018 [employer]  

## 2018-05-29 NOTE — Progress Notes (Signed)
  Routine Prenatal Care Visit  Subjective  Carolyn Wheeler is a 27 y.o. G2P1001 at 7675w1d being seen today for ongoing prenatal care.  She is currently monitored for the following issues for this low-risk pregnancy and has Supervision of other normal pregnancy, antepartum on their problem list.  ----------------------------------------------------------------------------------- Patient reports no complaints.   Contractions: Not present. Vag. Bleeding: None.  Movement: Present. Denies leaking of fluid.  ----------------------------------------------------------------------------------- The following portions of the patient's history were reviewed and updated as appropriate: allergies, current medications, past family history, past medical history, past social history, past surgical history and problem list. Problem list updated.   Objective  Blood pressure 102/64, weight 196 lb (88.9 kg), last menstrual period 09/11/2017. Pregravid weight 173 lb (78.5 kg) Total Weight Gain 23 lb (10.4 kg) Urinalysis: Urine Protein: Trace Urine Glucose: Negative  Fetal Status: Fetal Heart Rate (bpm): 147 Fundal Height: 36 cm Movement: Present     General:  Alert, oriented and cooperative. Patient is in no acute distress.  Skin: Skin is warm and dry. No rash noted.   Cardiovascular: Normal heart rate noted  Respiratory: Normal respiratory effort, no problems with respiration noted  Abdomen: Soft, gravid, appropriate for gestational age. Pain/Pressure: Present     Pelvic:  GBS/aptima collected, cervix appears closed Dilation: Closed      Extremities: Normal range of motion.  Edema: Trace  Mental Status: Normal mood and affect. Normal behavior. Normal judgment and thought content.   Assessment   27 y.o. G2P1001 at 6475w1d by  06/25/2018, by Ultrasound presenting for routine prenatal visit  Plan   pregnancy Problems (from 10/31/17 to present)    Problem Noted Resolved   Supervision of other normal  pregnancy, antepartum 10/31/2017 by Conard NovakJackson, Stephen D, MD No   Overview Addendum 04/22/2018  9:53 AM by Farrel ConnersGutierrez, Colleen, CNM    [facility] Blood type: O/Positive/-- (01/02 1045)   Genetic Screen declines Antibody:Negative (01/02 1045)  Anatomic US  Rubella: 1.44 (01/02 1045) Varicella: Imm  GTT  Third trimester:  RPR: Non Reactive (01/02 1045)   Rhogam n/a HBsAg: Negative (01/02 1045)   TDaP vaccine       6/24                Flu Shot: HIV: Non Reactive (01/02 1045)   Baby Food breast                    GBS:   Contraception unsure Pap: normal 11/07/17  CBB     CS/VBAC no   Support Person spouse             Preterm labor symptoms and general obstetric precautions including but not limited to vaginal bleeding, contractions, leaking of fluid and fetal movement were reviewed in detail with the patient. Please refer to After Visit Summary for other counseling recommendations.   Return in about 1 week (around 06/05/2018) for rob.  Tresea MallJane Billiejo Sorto, CNM 05/29/2018 10:07 AM

## 2018-05-31 LAB — CERVICOVAGINAL ANCILLARY ONLY
Chlamydia: NEGATIVE
Neisseria Gonorrhea: NEGATIVE

## 2018-05-31 LAB — STREP GP B NAA: Strep Gp B NAA: NEGATIVE

## 2018-06-04 ENCOUNTER — Ambulatory Visit (INDEPENDENT_AMBULATORY_CARE_PROVIDER_SITE_OTHER): Payer: BLUE CROSS/BLUE SHIELD | Admitting: Obstetrics and Gynecology

## 2018-06-04 VITALS — BP 110/80 | Wt 196.0 lb

## 2018-06-04 DIAGNOSIS — Z3480 Encounter for supervision of other normal pregnancy, unspecified trimester: Secondary | ICD-10-CM

## 2018-06-04 DIAGNOSIS — Z3A37 37 weeks gestation of pregnancy: Secondary | ICD-10-CM

## 2018-06-04 DIAGNOSIS — Z3483 Encounter for supervision of other normal pregnancy, third trimester: Secondary | ICD-10-CM

## 2018-06-04 NOTE — Progress Notes (Signed)
ROB

## 2018-06-04 NOTE — Progress Notes (Signed)
    Routine Prenatal Care Visit  Subjective  Carolyn Wheeler is a 27 y.o. G2P1001 at 4530w0d being seen today for ongoing prenatal care.  She is currently monitored for the following issues for this low-risk pregnancy and has Supervision of other normal pregnancy, antepartum on their problem list.  ----------------------------------------------------------------------------------- Patient reports no complaints.   Contractions: Not present. Vag. Bleeding: None.  Movement: Present. Denies leaking of fluid.  ----------------------------------------------------------------------------------- The following portions of the patient's history were reviewed and updated as appropriate: allergies, current medications, past family history, past medical history, past social history, past surgical history and problem list. Problem list updated.   Objective  Blood pressure 110/80, weight 196 lb (88.9 kg), last menstrual period 09/11/2017. Pregravid weight 173 lb (78.5 kg) Total Weight Gain 23 lb (10.4 kg) Urinalysis: Urine Protein: 1+ Urine Glucose: Negative  Fetal Status: Fetal Heart Rate (bpm): 140 Fundal Height: 37 cm Movement: Present  Presentation: Vertex  General:  Alert, oriented and cooperative. Patient is in no acute distress.  Skin: Skin is warm and dry. No rash noted.   Cardiovascular: Normal heart rate noted  Respiratory: Normal respiratory effort, no problems with respiration noted  Abdomen: Soft, gravid, appropriate for gestational age. Pain/Pressure: Present     Pelvic:  Cervical exam deferred        Extremities: Normal range of motion.     ental Status: Normal mood and affect. Normal behavior. Normal judgment and thought content.     Assessment   27 y.o. G2P1001 at 2930w0d by  06/25/2018, by Ultrasound presenting for routine prenatal visit  Plan   pregnancy Problems (from 10/31/17 to present)    Problem Noted Resolved   Supervision of other normal pregnancy, antepartum 10/31/2017  by Conard NovakJackson, Stephen D, MD No   Overview Addendum 04/22/2018  9:53 AM by Farrel ConnersGutierrez, Colleen, CNM    [facility] Blood type: O/Positive/-- (01/02 1045)   Genetic Screen declines Antibody:Negative (01/02 1045)  Anatomic US Normal Rubella: 1.44 (01/02 1045) Varicella: Imm  GTT  Third trimester:  RPR: Non Reactive (01/02 1045)   Rhogam n/a HBsAg: Negative (01/02 1045)   TDaP vaccine       6/24                Flu Shot: HIV: Non Reactive (01/02 1045)   Baby Food breast                    GBS:   Contraception unsure Pap: normal 11/07/17  CBB     CS/VBAC no   Support Person spouse             Gestational age appropriate obstetric precautions including but not limited to vaginal bleeding, contractions, leaking of fluid and fetal movement were reviewed in detail with the patient.    Return in about 1 week (around 06/11/2018) for ROB.  Vena AustriaAndreas Jaishon Krisher, MD, Evern CoreFACOG [facility], [facility] 06/04/2018, 4:14 PM

## 2018-06-12 ENCOUNTER — Ambulatory Visit (INDEPENDENT_AMBULATORY_CARE_PROVIDER_SITE_OTHER): Payer: BLUE CROSS/BLUE SHIELD | Admitting: Certified Nurse Midwife

## 2018-06-12 VITALS — BP 120/80 | Wt 198.0 lb

## 2018-06-12 DIAGNOSIS — Z3A38 38 weeks gestation of pregnancy: Secondary | ICD-10-CM

## 2018-06-12 DIAGNOSIS — M549 Dorsalgia, unspecified: Secondary | ICD-10-CM | POA: Diagnosis not present

## 2018-06-12 DIAGNOSIS — O99891 Other specified diseases and conditions complicating pregnancy: Secondary | ICD-10-CM

## 2018-06-12 DIAGNOSIS — O9989 Other specified diseases and conditions complicating pregnancy, childbirth and the puerperium: Secondary | ICD-10-CM

## 2018-06-12 DIAGNOSIS — O1213 Gestational proteinuria, third trimester: Secondary | ICD-10-CM

## 2018-06-12 LAB — POCT URINALYSIS DIPSTICK OB: Glucose, UA: NEGATIVE — AB

## 2018-06-12 LAB — POCT URINALYSIS DIPSTICK
Bilirubin, UA: NEGATIVE
Glucose, UA: NEGATIVE
KETONES UA: NEGATIVE
NITRITE UA: NEGATIVE
PH UA: 6 (ref 5.0–8.0)
PROTEIN UA: POSITIVE — AB
RBC UA: POSITIVE
SPEC GRAV UA: 1.015 (ref 1.010–1.025)
UROBILINOGEN UA: NEGATIVE U/dL — AB

## 2018-06-12 MED ORDER — AMOXICILLIN-POT CLAVULANATE 500-125 MG PO TABS: 1.0000 | ORAL_TABLET | Freq: Two times a day (BID) | ORAL | 0 refills | Status: DC

## 2018-06-12 NOTE — Progress Notes (Signed)
No concerns.rj 

## 2018-06-12 NOTE — Progress Notes (Signed)
ROB at 38wk1d: +FM. Having some right lower back pain. No dysuria. No vaginal bleeding. Irregular contractions. Increased pelvic pressure at times. GBS was negative. Denies headache, visual changes, CP, SOB, RUQ pain. BP 120/80 Urine dipstick: +3 protein, small leukocytes, negative nitrite, +blood Results for orders placed or performed in visit on 06/12/18 (from the past 24 hour(s))  POC Urinalysis Dipstick OB     Status: Abnormal   Collection Time: 06/12/18 10:09 AM  Result Value Ref Range   Color, UA     Clarity, UA     Glucose, UA Negative (A) (none)   Bilirubin, UA     Ketones, UA     Spec Grav, UA     Blood, UA     pH, UA     POC Protein UA Large (3+) (A) Negative, Trace   Urobilinogen, UA     Nitrite, UA     Leukocytes, UA     Appearance     Odor    POCT urinalysis dipstick     Status: Abnormal   Collection Time: 06/12/18 10:22 AM  Result Value Ref Range   Color, UA     Clarity, UA     Glucose, UA Negative Negative   Bilirubin, UA neg    Ketones, UA neg    Spec Grav, UA 1.015 1.010 - 1.025   Blood, UA positive    pH, UA 6.0 5.0 - 8.0   Protein, UA Positive (A) Negative   Urobilinogen, UA negative (A) 0.2 or 1.0 E.U./dL   Nitrite, UA neg    Leukocytes, UA Small (1+) (A) Negative   Appearance     Odor    A: IUP at 38wk1day Suspect UTI P: Start Augmentin 500/125 BID while awaiting urine culture results Urine culture Preeclampsia and labor precautions FU in 2 days for BP check.  Farrel Connersolleen Danen Lapaglia, CNM

## 2018-06-14 ENCOUNTER — Encounter: Payer: Self-pay | Admitting: Maternal Newborn

## 2018-06-14 ENCOUNTER — Ambulatory Visit (INDEPENDENT_AMBULATORY_CARE_PROVIDER_SITE_OTHER): Payer: BLUE CROSS/BLUE SHIELD | Admitting: Maternal Newborn

## 2018-06-14 VITALS — BP 128/70 | Wt 199.0 lb

## 2018-06-14 DIAGNOSIS — Z3480 Encounter for supervision of other normal pregnancy, unspecified trimester: Secondary | ICD-10-CM

## 2018-06-14 DIAGNOSIS — Z3A38 38 weeks gestation of pregnancy: Secondary | ICD-10-CM

## 2018-06-14 DIAGNOSIS — Z3483 Encounter for supervision of other normal pregnancy, third trimester: Secondary | ICD-10-CM

## 2018-06-14 LAB — POCT URINALYSIS DIPSTICK OB: Glucose, UA: NEGATIVE — AB

## 2018-06-14 LAB — URINE CULTURE

## 2018-06-14 NOTE — Progress Notes (Signed)
    Routine Prenatal Care Visit  Subjective  Carolyn Wheeler is a 27 y.o. G2P1001 at 5725w3d being seen today for ongoing prenatal care.  She is currently monitored for the following issues for this low-risk pregnancy and has Supervision of other normal pregnancy, antepartum on their problem list.  ----------------------------------------------------------------------------------- Patient reports no complaints. Stable pedal and LE edema, same as it has been for several weeks. Denies headaches, visual changes, epigastric pain. Contractions: Irregular. Vag. Bleeding: None.  Movement: Present. No leaking of fluid.  ----------------------------------------------------------------------------------- The following portions of the patient's history were reviewed and updated as appropriate: allergies, current medications, past family history, past medical history, past social history, past surgical history and problem list. Problem list updated.   Objective  Blood pressure 128/70, weight 199 lb (90.3 kg), last menstrual period 09/11/2017. Pregravid weight 173 lb (78.5 kg) Total Weight Gain 26 lb (11.8 kg) Urinalysis: Glucose Negative, Protein Moderate Fetal Status: Fetal Heart Rate (bpm): 138   Movement: Present     General:  Alert, oriented and cooperative. Patient is in no acute distress.  Skin: Skin is warm and dry. No rash noted.   Cardiovascular: Normal heart rate noted  Respiratory: Normal respiratory effort, no problems with respiration noted  Abdomen: Soft, gravid, appropriate for gestational age. Pain/Pressure: Present     Pelvic:  Cervical exam deferred        Extremities: Normal range of motion.  Edema: Mild pitting, slight indentation  Mental Status: Normal mood and affect. Normal behavior. Normal judgment and thought content.     Assessment   27 y.o. G2P1001 at 7625w3d, EDD 06/25/2018 by Ultrasound presenting for routine prenatal visit..  Plan   pregnancy Problems (from  10/31/17 to present)    Problem Noted Resolved   Supervision of other normal pregnancy, antepartum 10/31/2017 by Conard NovakJackson, Stephen D, MD No   Overview Addendum 06/05/2018  6:06 PM by Vena AustriaStaebler, Andreas, MD    [facility] Blood type: O/Positive/-- (01/02 1045)   Genetic Screen declines Antibody:Negative (01/02 1045)  Anatomic US Normal Rubella: 1.44 (01/02 1045) Varicella: Imm  GTT  Third trimester:  RPR: Non Reactive (01/02 1045)   Rhogam n/a HBsAg: Negative (01/02 1045)   TDaP vaccine       6/24                Flu Shot: HIV: Non Reactive (01/02 1045)   Baby Food breast                    GBS:   Contraception unsure Pap: normal 11/07/17  CBB     CS/VBAC no   Support Person spouse          Blood pressure normal today, 128/70. Aware to come to triage with symptoms of pre-eclampsia. Continue antibiotics for UTI.  Term labor symptoms and general obstetric precautions were reviewed.  Return in about 1 week (around 06/21/2018) for ROB.  Marcelyn BruinsJacelyn Rachal Dvorsky, CNM 06/14/2018  8:53 AM

## 2018-06-14 NOTE — Progress Notes (Signed)
Blood pressure recheck per CG No concerns

## 2018-06-17 ENCOUNTER — Other Ambulatory Visit: Payer: Self-pay | Admitting: Certified Nurse Midwife

## 2018-06-17 DIAGNOSIS — R809 Proteinuria, unspecified: Secondary | ICD-10-CM | POA: Insufficient documentation

## 2018-06-17 DIAGNOSIS — O1213 Gestational proteinuria, third trimester: Secondary | ICD-10-CM | POA: Insufficient documentation

## 2018-06-21 ENCOUNTER — Ambulatory Visit (INDEPENDENT_AMBULATORY_CARE_PROVIDER_SITE_OTHER): Payer: BLUE CROSS/BLUE SHIELD | Admitting: Obstetrics and Gynecology

## 2018-06-21 VITALS — BP 130/72 | Wt 200.0 lb

## 2018-06-21 DIAGNOSIS — Z3480 Encounter for supervision of other normal pregnancy, unspecified trimester: Secondary | ICD-10-CM

## 2018-06-21 DIAGNOSIS — Z3A39 39 weeks gestation of pregnancy: Secondary | ICD-10-CM

## 2018-06-21 DIAGNOSIS — O1213 Gestational proteinuria, third trimester: Secondary | ICD-10-CM

## 2018-06-21 LAB — POCT URINALYSIS DIPSTICK OB: GLUCOSE, UA: NEGATIVE — AB

## 2018-06-21 NOTE — Progress Notes (Signed)
    Routine Prenatal Care Visit  Subjective  Carolyn Wheeler is a 27 y.o. G2P1001 at 6712w3d being seen today for ongoing prenatal care.  She is currently monitored for the following issues for this low-risk pregnancy and has Supervision of other normal pregnancy, antepartum and Proteinuria affecting pregnancy in third trimester on their problem list.  ----------------------------------------------------------------------------------- Patient reports no complaints.   Contractions: Not present. Vag. Bleeding: Bloody Show.  Movement: Present. Denies leaking of fluid.  ----------------------------------------------------------------------------------- The following portions of the patient's history were reviewed and updated as appropriate: allergies, current medications, past family history, past medical history, past social history, past surgical history and problem list. Problem list updated.   Objective  Blood pressure 130/72, weight 200 lb (90.7 kg), last menstrual period 09/11/2017. Pregravid weight 173 lb (78.5 kg) Total Weight Gain 27 lb (12.2 kg) Urinalysis:      Fetal Status: Fetal Heart Rate (bpm): 145 Fundal Height: 39 cm Movement: Present  Presentation: Vertex  General:  Alert, oriented and cooperative. Patient is in no acute distress.  Skin: Skin is warm and dry. No rash noted.   Cardiovascular: Normal heart rate noted  Respiratory: Normal respiratory effort, no problems with respiration noted  Abdomen: Soft, gravid, appropriate for gestational age. Pain/Pressure: Absent     Pelvic:  Cervical exam performed Dilation: 3 Effacement (%): 50 Station: -3 membranes wept at maternal request.  Extremities: Normal range of motion.     ental Status: Normal mood and affect. Normal behavior. Normal judgment and thought content.     Assessment   27 y.o. G2P1001 at 5912w3d by  06/25/2018, by Ultrasound presenting for routine prenatal visit  Plan   pregnancy Problems (from 10/31/17 to  present)    Problem Noted Resolved   Supervision of other normal pregnancy, antepartum 10/31/2017 by Conard NovakJackson, Stephen D, MD No   Overview Addendum 06/05/2018  6:06 PM by Vena AustriaStaebler, Andreas, MD    [facility] Blood type: O/Positive/-- (01/02 1045)   Genetic Screen declines Antibody:Negative (01/02 1045)  Anatomic US Normal Rubella: 1.44 (01/02 1045) Varicella: Imm  GTT  Third trimester:  RPR: Non Reactive (01/02 1045)   Rhogam n/a HBsAg: Negative (01/02 1045)   TDaP vaccine       6/24                Flu Shot: HIV: Non Reactive (01/02 1045)   Baby Food breast                    GBS:   Contraception unsure Pap: normal 11/07/17  CBB     CS/VBAC no   Support Person spouse             Gestational age appropriate obstetric precautions including but not limited to vaginal bleeding, contractions, leaking of fluid and fetal movement were reviewed in detail with the patient.    Scheduled for IOL on 06/22/18 at 8am, concern for pedal edema and proteinuria, blood pressures WNL today. Discussed warning signs of Preeclampsia.  Return if symptoms worsen or fail to improve.  Adelene Idlerhristanna Lodie Waheed MD [facility], [facility] 06/21/18 11:23 AM

## 2018-06-21 NOTE — Progress Notes (Signed)
ROB

## 2018-06-22 ENCOUNTER — Other Ambulatory Visit: Payer: Self-pay

## 2018-06-22 ENCOUNTER — Inpatient Hospital Stay
Admission: EM | Admit: 2018-06-22 | Discharge: 2018-06-24 | DRG: 807 | Disposition: A | Discharge status: Home / Self Care | Payer: BLUE CROSS/BLUE SHIELD | Attending: Obstetrics & Gynecology | Admitting: Obstetrics & Gynecology

## 2018-06-22 ENCOUNTER — Encounter: Payer: Self-pay | Admitting: *Deleted

## 2018-06-22 DIAGNOSIS — O1214 Gestational proteinuria, complicating childbirth: Principal | ICD-10-CM | POA: Diagnosis present

## 2018-06-22 DIAGNOSIS — Z23 Encounter for immunization: Secondary | ICD-10-CM | POA: Diagnosis not present

## 2018-06-22 DIAGNOSIS — Z3480 Encounter for supervision of other normal pregnancy, unspecified trimester: Secondary | ICD-10-CM

## 2018-06-22 DIAGNOSIS — Z3A39 39 weeks gestation of pregnancy: Secondary | ICD-10-CM | POA: Diagnosis not present

## 2018-06-22 LAB — TYPE AND SCREEN
ABO/RH(D): O POS
Antibody Screen: NEGATIVE

## 2018-06-22 LAB — PROTEIN / CREATININE RATIO, URINE
CREATININE, URINE: 80 mg/dL
Protein Creatinine Ratio: 2.11 mg/mg{Cre} — ABNORMAL HIGH (ref 0.00–0.15)
Total Protein, Urine: 169 mg/dL

## 2018-06-22 LAB — CBC
HEMATOCRIT: 37.9 % (ref 35.0–47.0)
Hemoglobin: 12.7 g/dL (ref 12.0–16.0)
MCH: 28.8 pg (ref 26.0–34.0)
MCHC: 33.5 g/dL (ref 32.0–36.0)
MCV: 86 fL (ref 80.0–100.0)
Platelets: 243 10*3/uL (ref 150–440)
RBC: 4.41 MIL/uL (ref 3.80–5.20)
RDW: 16.3 % — AB (ref 11.5–14.5)
WBC: 12.3 10*3/uL — AB (ref 3.6–11.0)

## 2018-06-22 LAB — COMPREHENSIVE METABOLIC PANEL
ALT: 14 U/L (ref 0–44)
AST: 23 U/L (ref 15–41)
Albumin: 2.9 g/dL — ABNORMAL LOW (ref 3.5–5.0)
Alkaline Phosphatase: 128 U/L — ABNORMAL HIGH (ref 38–126)
Anion gap: 7 (ref 5–15)
BILIRUBIN TOTAL: 0.6 mg/dL (ref 0.3–1.2)
BUN: 13 mg/dL (ref 6–20)
CALCIUM: 8.3 mg/dL — AB (ref 8.9–10.3)
CO2: 22 mmol/L (ref 22–32)
CREATININE: 0.59 mg/dL (ref 0.44–1.00)
Chloride: 108 mmol/L (ref 98–111)
GFR calc Af Amer: >60 mL/min (ref >60)
Glucose, Bld: 79 mg/dL (ref 70–99)
POTASSIUM: 4.1 mmol/L (ref 3.5–5.1)
Sodium: 137 mmol/L (ref 135–145)
TOTAL PROTEIN: 6.8 g/dL (ref 6.5–8.1)

## 2018-06-22 MED ORDER — OXYCODONE-ACETAMINOPHEN 5-325 MG PO TABS: 1.0000 | ORAL_TABLET | ORAL | Status: DC | PRN

## 2018-06-22 MED ORDER — ONDANSETRON HCL 4 MG/2ML IJ SOLN: 4.0000 mg | Freq: Four times a day (QID) | INTRAMUSCULAR | Status: DC | PRN

## 2018-06-22 MED ORDER — ACETAMINOPHEN 325 MG PO TABS: 650.0000 mg | ORAL_TABLET | ORAL | Status: DC | PRN

## 2018-06-22 MED ORDER — MISOPROSTOL 200 MCG PO TABS
ORAL_TABLET | ORAL | Status: AC
  Filled 2018-06-22: qty 4

## 2018-06-22 MED ORDER — IBUPROFEN 600 MG PO TABS
600.0000 mg | ORAL_TABLET | Freq: Four times a day (QID) | ORAL | Status: DC
  Administered 2018-06-22: 600 mg via ORAL
  Filled 2018-06-22: qty 1

## 2018-06-22 MED ORDER — WITCH HAZEL-GLYCERIN EX PADS: 1.0000 "application " | MEDICATED_PAD | CUTANEOUS | Status: DC | PRN

## 2018-06-22 MED ORDER — ONDANSETRON HCL 4 MG/2ML IJ SOLN: 4.0000 mg | INTRAMUSCULAR | Status: DC | PRN

## 2018-06-22 MED ORDER — DIPHENHYDRAMINE HCL 25 MG PO CAPS: 25.0000 mg | ORAL_CAPSULE | Freq: Four times a day (QID) | ORAL | Status: DC | PRN

## 2018-06-22 MED ORDER — DIBUCAINE 1 % RE OINT: 1.0000 "application " | TOPICAL_OINTMENT | RECTAL | Status: DC | PRN

## 2018-06-22 MED ORDER — ONDANSETRON HCL 4 MG PO TABS: 4.0000 mg | ORAL_TABLET | ORAL | Status: DC | PRN

## 2018-06-22 MED ORDER — MISOPROSTOL 25 MCG QUARTER TABLET
25.0000 ug | ORAL_TABLET | Freq: Once | ORAL | Status: AC
  Administered 2018-06-22: 25 ug via BUCCAL
  Filled 2018-06-22: qty 1

## 2018-06-22 MED ORDER — OXYCODONE-ACETAMINOPHEN 5-325 MG PO TABS: 2.0000 | ORAL_TABLET | ORAL | Status: DC | PRN

## 2018-06-22 MED ORDER — OXYTOCIN BOLUS FROM INFUSION
500.0000 mL | Freq: Once | INTRAVENOUS | Status: DC
  Administered 2018-06-22: 500 mL via INTRAVENOUS

## 2018-06-22 MED ORDER — COCONUT OIL OIL: 1.0000 "application " | TOPICAL_OIL | Status: DC | PRN

## 2018-06-22 MED ORDER — BENZOCAINE-MENTHOL 20-0.5 % EX AERO
1.0000 "application " | INHALATION_SPRAY | CUTANEOUS | Status: DC | PRN
  Filled 2018-06-22: qty 56

## 2018-06-22 MED ORDER — OXYTOCIN 40 UNITS IN LACTATED RINGERS INFUSION - SIMPLE MED
2.5000 [IU]/h | INTRAVENOUS | Status: DC
  Filled 2018-06-22: qty 1000

## 2018-06-22 MED ORDER — LACTATED RINGERS IV SOLN: 500.0000 mL | INTRAVENOUS | Status: DC | PRN

## 2018-06-22 MED ORDER — PRENATAL MULTIVITAMIN CH
1.0000 | ORAL_TABLET | Freq: Every day | ORAL | Status: DC
  Administered 2018-06-23: 1 via ORAL
  Filled 2018-06-22: qty 1

## 2018-06-22 MED ORDER — ACETAMINOPHEN 325 MG PO TABS
650.0000 mg | ORAL_TABLET | ORAL | Status: DC | PRN
  Administered 2018-06-22 (×2): 650 mg via ORAL
  Filled 2018-06-22 (×2): qty 2

## 2018-06-22 MED ORDER — OXYTOCIN 10 UNIT/ML IJ SOLN
INTRAMUSCULAR | Status: AC
  Filled 2018-06-22: qty 2

## 2018-06-22 MED ORDER — AMMONIA AROMATIC IN INHA
RESPIRATORY_TRACT | Status: AC
  Filled 2018-06-22: qty 10

## 2018-06-22 MED ORDER — SIMETHICONE 80 MG PO CHEW: 80.0000 mg | CHEWABLE_TABLET | ORAL | Status: DC | PRN

## 2018-06-22 MED ORDER — LIDOCAINE HCL (PF) 1 % IJ SOLN: 30.0000 mL | INTRAMUSCULAR | Status: DC | PRN

## 2018-06-22 MED ORDER — LACTATED RINGERS IV SOLN
INTRAVENOUS | Status: DC
  Administered 2018-06-22: 09:00:00 via INTRAVENOUS

## 2018-06-22 MED ORDER — SENNOSIDES-DOCUSATE SODIUM 8.6-50 MG PO TABS
2.0000 | ORAL_TABLET | ORAL | Status: DC
  Administered 2018-06-22 – 2018-06-24 (×2): 2 via ORAL
  Filled 2018-06-22 (×3): qty 2

## 2018-06-22 MED ORDER — IBUPROFEN 600 MG PO TABS
600.0000 mg | ORAL_TABLET | Freq: Four times a day (QID) | ORAL | Status: DC
  Administered 2018-06-22 – 2018-06-24 (×7): 600 mg via ORAL
  Filled 2018-06-22 (×7): qty 1

## 2018-06-22 MED ORDER — TERBUTALINE SULFATE 1 MG/ML IJ SOLN: 0.2500 mg | Freq: Once | INTRAMUSCULAR | Status: DC | PRN

## 2018-06-22 MED ORDER — BUTORPHANOL TARTRATE 2 MG/ML IJ SOLN
1.0000 mg | INTRAMUSCULAR | Status: DC | PRN
  Administered 2018-06-22: 1 mg via INTRAVENOUS
  Filled 2018-06-22: qty 1

## 2018-06-22 MED ORDER — MISOPROSTOL 25 MCG QUARTER TABLET
25.0000 ug | ORAL_TABLET | ORAL | Status: DC | PRN
  Administered 2018-06-22: 25 ug via VAGINAL
  Filled 2018-06-22: qty 1

## 2018-06-22 NOTE — OB Triage Note (Signed)
Patient to LDR 5 for induction of labor.

## 2018-06-22 NOTE — Discharge Summary (Signed)
OB Discharge Summary     Patient Name: Carolyn Wheeler DOB: 27-Jan-1991 MRN: [id number]  Date of admission: 06/22/2018 Delivering MD: Tresea MallJane Gledhill, CNM  Date of Delivery: 06/22/2018  Date of discharge: 06/24/2018  Admitting diagnosis: Induction of labor, proteinuria Intrauterine pregnancy: 7549w4d     Secondary diagnosis: None     Discharge diagnosis: Term Pregnancy Delivered                                                                                                Post partum procedures: none  Augmentation: Cytotec   Complications: None  Hospital course:  Induction of Labor With Vaginal Delivery   27 y.o. yo G2P1001 at 5849w4d was admitted to the hospital 06/22/2018 for induction of labor.  Indication for induction: proteinuria.  Patient had an uncomplicated labor course as follows: Spontaneous Membrane Rupture Time/Date: 2:05 PM ,06/22/2018   Patient had delivery of a viable female 3:07 PM, 06/22/2018  Details of delivery can be found in separate delivery note.   Patient had a routine postpartum course. She is voiding and ambulating without difficulty and tolerating a regular diet. Patient is discharged home in good condition on 06/24/2018.  Physical exam  Vitals:   06/23/18 1617 06/23/18 2101 06/24/18 0033 06/24/18 0743  BP: 116/84 119/68 136/79 100/89  Pulse: 70 90 79 76  Resp: 18 18 18 20   Temp: 98 F (36.7 C) 98.6 F (37 C) 98 F (36.7 C) 97.9 F (36.6 C)  TempSrc: Oral Oral Oral Oral  SpO2:  100% 98% 99%  Weight:      Height:       General: alert, cooperative and no distress Lochia: appropriate Uterine Fundus: firm Incision: N/A DVT Evaluation: No evidence of DVT seen on physical exam.  Labs: Lab Results  Component Value Date   WBC 11.9 (H) 06/23/2018   HGB 11.3 (L) 06/23/2018   HCT 34.0 (L) 06/23/2018   MCV 85.4 06/23/2018   PLT 213 06/23/2018    Discharge instruction: per After Visit Summary.  Medications:  Allergies as of 06/24/2018   No Known  Allergies     Medication List    STOP taking these medications   amoxicillin-clavulanate 500-125 MG tablet Commonly known as:  AUGMENTIN     TAKE these medications   multivitamin-prenatal 27-0.8 MG Tabs tablet Take 1 tablet by mouth daily at 12 noon.   norethindrone 0.35 MG tablet Commonly known as:  MICRONOR,CAMILA,ERRIN Take 1 tablet (0.35 mg total) by mouth daily.       Diet: routine diet  Activity: Advance as tolerated. Pelvic rest for 6 weeks.   Outpatient follow up: Follow-up Information    Tresea MallGledhill, Jane, CNM. Schedule an appointment as soon as possible for a visit in 6 week(s).   Specialty:  Obstetrics Why:  Please call to schedule your 6 week postpartum follow up appointment with Tresea MallJane Gledhill Contact information: [address] [place] [address] [phone]             Postpartum contraception: Progesterone only pills Rhogam Given postpartum: NA Rubella vaccine given postpartum: Rubella Immune Varicella vaccine given postpartum:  Varicella Immune TDaP given antepartum or postpartum: given antepartum  Newborn Data: Live born female  Birth Weight: 7 lb 6.2 oz (3350 g) APGAR: 8, 9  Newborn Delivery   Birth date/time:  06/22/2018 15:07:00 Delivery type:  Vaginal, Spontaneous      Baby Feeding: Breast and formula  Disposition: home with mother  SIGNED:  Oswaldo Conroy, CNM 06/24/2018 8:50 AM

## 2018-06-22 NOTE — Progress Notes (Signed)
  Labor Progress Note   27 y.o. G2P1001 @ 4644w4d , admitted for  Pregnancy, Labor Management. Induction of labor  Subjective:  Doing well. Has been feeling strong contractions in the front. Has not felt one for about 15 minutes. Would like to get up and walk.  Objective:  BP 117/79   Pulse 79   Temp 98.2 F (36.8 C) (Oral)   Resp 16   Ht 5\' 4"  (1.626 m)   Wt 90.7 kg   LMP 09/11/2017   BMI 34.33 kg/m  Abd: mild Extr: trace to 1+ bilateral pedal edema SVE: CERVIX: 4.5 cm dilated, 60 effaced, -2 station, cervix swept  EFM: FHR: 125 bpm, variability: moderate,  accelerations:  Present,  decelerations:  Absent Toco: Frequency: Every 2-4 minutes Labs: I have reviewed the patient's lab results.  Results for Leda QuailFUENTES, Carolyn S (MRN [id number]) as of 06/22/2018 12:43  Ref. Range 06/22/2018 08:37  Total Protein, Urine Latest Units: mg/dL [id number]  Protein Creatinine Ratio Latest Ref Range: 0.00 - 0.15 mg/mgCre 2.11 (H)  Creatinine, Urine Latest Units: mg/dL 80     Assessment & Plan:  G2P1001 @ 344w4d, admitted for  Pregnancy and Labor/Delivery Management  1. Pain management: position changes. 2. FWB: FHT category I.  3. ID: GBS negative 4. Labor management: s/p 1 dose cytotec PV/Buccal, OOB to walk for up to 1 hour, reassess contraction pattern for start of pitocin    Carolyn Wheeler, CNM [facility], [facility] 06/22/2018  12:35 PM

## 2018-06-22 NOTE — Lactation Note (Signed)
This note was copied from a baby's chart. Lactation Consultation Note  Patient Name: Carolyn Wheeler XBMWU'XToday's Date: 06/22/2018 Reason for consult: Follow-up assessment;Term Mom having difficulty waking Carolyn Wheeler swaddled.  Demonstrated waking techniques.  Once she was awake, she latched deeply with strong rhythmic sucking with occasional swallows for 20 minutes.  Lactation name and number written on white board. Maternal Data Formula Feeding for Exclusion: No Has patient been taught Hand Expression?: Yes(Can easily hand express lots of colostrum) Does the patient have breastfeeding experience prior to this delivery?: Yes  Feeding Feeding Type: Breast Fed Length of feed: 20 min  LATCH Score Latch: Repeated attempts needed to sustain latch, nipple held in mouth throughout feeding, stimulation needed to elicit sucking reflex.  Audible Swallowing: A few with stimulation  Type of Nipple: Everted at rest and after stimulation  Comfort (Breast/Nipple): Soft / non-tender  Hold (Positioning): Assistance needed to correctly position infant at breast and maintain latch.  LATCH Score: 7  Interventions Interventions: Assisted with latch;Breast compression;Adjust position;Breast massage;Support pillows  Lactation Tools Discussed/Used WIC Program: No(BCBS Insurance)   Consult Status Consult Status: PRN Date: 06/22/18 Follow-up type: Call as needed    Louis MeckelWilliams, Alura Olveda Kay 06/22/2018, 7:32 PM

## 2018-06-22 NOTE — Lactation Note (Signed)
This note was copied from a baby's chart. Lactation Consultation Note  Patient Name: Carolyn Albertine GratesJeanette Mori WJXBJ'YToday's Date: 06/22/2018 Reason for consult: Initial assessment;Term;1st time breastfeeding Assisted mom in comfortable biological cradle hold position skin to skin.  Good rhythmic sucking noted.  [name] will come off the breast periodically crying, but latches back without difficulty.  Demonstrated hand expression.  Can easily hand express lots of colostrum.  Mom reports only breast feeding first baby for 1 month because felt like never had enough milk for him.  Reviewed supply and demand and need to breast feed frequently to bring in mature milk and ensure a plentiful milk supply.  Reviewed normal course of lactation and routine newborn feeding patterns.     Maternal Data Formula Feeding for Exclusion: No Has patient been taught Hand Expression?: Yes Does the patient have breastfeeding experience prior to this delivery?: Yes  Feeding Feeding Type: Breast Fed Length of feed: 30 min  LATCH Score Latch: Repeated attempts needed to sustain latch, nipple held in mouth throughout feeding, stimulation needed to elicit sucking reflex.  Audible Swallowing: A few with stimulation  Type of Nipple: Everted at rest and after stimulation  Comfort (Breast/Nipple): Soft / non-tender  Hold (Positioning): Assistance needed to correctly position infant at breast and maintain latch.  LATCH Score: 7  Interventions Interventions: Breast feeding basics reviewed;Reverse pressure;Assisted with latch;Breast compression;Skin to skin;Adjust position;Breast massage;Support pillows;Position options;Expressed milk  Lactation Tools Discussed/Used WIC Program: [employer](BCBS Insurance)   Consult Status Consult Status: Follow-up Date: 06/22/18 Follow-up type: Call as needed    Louis MeckelWilliams, Tashana Haberl Kay 06/22/2018, 4:43 PM

## 2018-06-22 NOTE — H&P (Signed)
OB History & Physical   History of Present Illness:  Chief Complaint: here for induction of labor  HPI:  Carolyn Wheeler is a 27 y.o. G2P1001 female at [redacted]w[redacted]d dated by ultrasound.  Her pregnancy has been complicated by proteinuria affecting pregnancy in third trimester.    She reports occasional contractions.   She reports leakage of mucous plug.   She denies vaginal bleeding.   She reports fetal movement.  Her blood pressure has been mildly elevated over her past few prenatal visits above her baseline and she has had 2+ proteinuria. She denies headache, visual changes or epigastric pain.   Maternal Medical History:   Past Medical History:  Diagnosis Date  . Abnormal Pap smear of cervix 12/04/2013   [place] - pos hpv  . History of Papanicolaou smear of cervix 12/04/2013; [id number]   [place] hpv +; neg -/-/-    Past Surgical History:  Procedure Laterality Date  . TYMPANOSTOMY TUBE PLACEMENT    . WISDOM TOOTH EXTRACTION      No Known Allergies  Prior to Admission medications   Medication Sig Start Date End Date Taking? Authorizing Provider  amoxicillin-clavulanate (AUGMENTIN) 500-125 MG tablet Take 1 tablet (500 mg total) by mouth 2 (two) times daily. 06/12/18  Yes Farrel Conners, CNM  Prenatal Vit-Fe Fumarate-FA (MULTIVITAMIN-PRENATAL) 27-0.8 MG TABS tablet Take 1 tablet by mouth daily at 12 noon.   Yes [provider]    OB History  Gravida Para Term Preterm AB Living  2 1 1     1   SAB TAB Ectopic Multiple Live Births          1    # Outcome Date GA Lbr Len/2nd Weight Sex Delivery Anes PTL Lv  2 Current           1 Term 06/21/14 [redacted]w[redacted]d  4082 g M Vag-Spont   LIV    Prenatal care site: [facility]  Social History: She  reports that she has never smoked. She has never used smokeless tobacco. She reports that she does not drink alcohol or use drugs.  Family History: family history is not on file.    Review of Systems: Negative x 10 systems reviewed except  as noted in the HPI.    Physical Exam:  Vital Signs: BP 121/80   Pulse 85   Temp 98.2 F (36.8 C) (Oral)   Resp 16   Ht 5\' 4"  (1.626 m)   Wt 90.7 kg   LMP 09/11/2017   BMI 34.33 kg/m  Constitutional: Well nourished, well developed female in no acute distress.  HEENT: normal Skin: Warm and dry.  Cardiovascular: Regular rate and rhythm.   Extremity: reflexes 2+  Respiratory: Clear to auscultation bilateral. Normal respiratory effort Abdomen: FHT present Back: no CVAT Neuro: DTRs 2+, Cranial nerves grossly intact Psych: Alert and Oriented x3. No memory deficits. Normal mood and affect.  MS: normal gait, normal bilateral lower extremity ROM/strength/stability.  Pelvic exam: 3/50/-2 to -3 by Carolyn Ida RN  Results for Carolyn, Wheeler (MRN [id number]) as of 06/22/2018 10:05  Ref. Range 06/22/2018 08:35  Sodium Latest Ref Range: 135 - 145 mmol/L 137  Potassium Latest Ref Range: 3.5 - 5.1 mmol/L 4.1  Chloride Latest Ref Range: 98 - 111 mmol/L 108  CO2 Latest Ref Range: 22 - 32 mmol/L 22  Glucose Latest Ref Range: 70 - 99 mg/dL 79  BUN Latest Ref Range: 6 - 20 mg/dL 13  Creatinine Latest Ref Range: 0.44 - 1.00 mg/dL  0.59  Calcium Latest Ref Range: 8.9 - 10.3 mg/dL 8.3 (L)  Anion gap Latest Ref Range: 5 - 15  7  Alkaline Phosphatase Latest Ref Range: 38 - 126 U/L 128 (H)  Albumin Latest Ref Range: 3.5 - 5.0 g/dL 2.9 (L)  AST Latest Ref Range: 15 - 41 U/L 23  ALT Latest Ref Range: 0 - 44 U/L 14  Total Protein Latest Ref Range: 6.5 - 8.1 g/dL 6.8  Total Bilirubin Latest Ref Range: 0.3 - 1.2 mg/dL 0.6  GFR, Est Non African American Latest Ref Range: >60 mL/min >60  GFR, Est African American Latest Ref Range: >60 mL/min >60  WBC Latest Ref Range: 3.6 - 11.0 K/uL 12.3 (H)  RBC Latest Ref Range: 3.80 - 5.20 MIL/uL 4.41  Hemoglobin Latest Ref Range: 12.0 - 16.0 g/dL 16.112.7  HCT Latest Ref Range: 35.0 - 47.0 % 37.9  MCV Latest Ref Range: 80.0 - 100.0 fL 86.0  MCH Latest Ref Range: 26.0  - 34.0 pg 28.8  MCHC Latest Ref Range: 32.0 - 36.0 g/dL 09.633.5  RDW Latest Ref Range: 11.5 - 14.5 % 16.3 (H)  Platelets Latest Ref Range: 150 - 440 K/uL 243   Urine Protein Creatinine Ration: pending  Pertinent Results:  Prenatal Labs: Blood type/Rh O positive  Antibody screen negative  Rubella Immune  Varicella Immune    RPR Non-reactive  HBsAg negative  HIV negative  GC negative  Chlamydia negative  Genetic screening Not done  1 hour GTT 101  3 hour GTT NA  GBS negative on 7/31   Baseline FHR: 130 beats/min   Variability: moderate   Accelerations: present   Decelerations: absent Contractions: present frequency: every 8 minutes Overall assessment: Reassuring    Assessment:  Carolyn Wheeler is a 27 y.o. 592P1001 female at 2917w4d presenting for induction of labor for mildly elevated blood pressure over baseline and proteinuria  Plan:  1. Admit to Labor & Delivery  2. CBC, T&S, Clrs, IVF 3. GBS negative.   4. Fetal well-being: Category I 5. Begin induction with cytotec 25 mcg PV/25 mcg Buccal  Carolyn Wheeler, CNM 06/22/2018 10:00 AM

## 2018-06-23 LAB — CBC
HEMATOCRIT: 34 % — AB (ref 35.0–47.0)
HEMOGLOBIN: 11.3 g/dL — AB (ref 12.0–16.0)
MCH: 28.4 pg (ref 26.0–34.0)
MCHC: 33.2 g/dL (ref 32.0–36.0)
MCV: 85.4 fL (ref 80.0–100.0)
Platelets: 213 10*3/uL (ref 150–440)
RBC: 3.98 MIL/uL (ref 3.80–5.20)
RDW: 16.5 % — ABNORMAL HIGH (ref 11.5–14.5)
WBC: 11.9 10*3/uL — ABNORMAL HIGH (ref 3.6–11.0)

## 2018-06-23 NOTE — Lactation Note (Signed)
This note was copied from a baby's chart. Lactation Consultation Note  Patient Name: Carolyn Wheeler ZOXWR'UToday's Date: 06/23/2018  Mom not aggressive with waking Carolyn GuarneriIsabella to breast feed nutritively.  Instead, she lets her just stay on the breast and sleep.  Reminded mom how to massage breast and stimulate Carolyn Guarnerisabella to keep her awake and actively breast feeding.  After intermittent stimulation she sustained a deep latch and continued to suck vigorously and rhythmically at the breast.     Maternal Data    Feeding Feeding Type: Breast Fed Length of feed: 15 min  LATCH Score                   Interventions    Lactation Tools Discussed/Used     Consult Status      Louis MeckelWilliams, Tayon Parekh Kay 06/23/2018, 8:49 PM

## 2018-06-23 NOTE — Progress Notes (Signed)
Post Partum Day 1 Subjective: Doing well, no complaints.  Tolerating regular diet, pain with PO meds, voiding and ambulating without difficulty. Breastfeeding going well  No CP SOB F/C N/V or leg pain No HA, change of vision, RUQ/epigastric pain  Objective: BP 109/88 (BP Location: Left Arm)   Pulse 78   Temp 98.2 F (36.8 C) (Oral)   Resp 20   Ht 5\' 4"  (1.626 m)   Wt 90.7 kg   LMP 09/11/2017   SpO2 99%   BMI 34.33 kg/m    Physical Exam:  General: NAD CV: RRR Pulm: nl effort, CTABL Lochia: moderate Uterine Fundus: fundus firm and below umbilicus DVT Evaluation: no cords, ttp LEs   Recent Labs    06/22/18 0835 06/23/18 0651  HGB 12.7 11.3*  HCT 37.9 34.0*  WBC 12.3* 11.9*  PLT 243 213    Assessment/Plan: 27 y.o. G2P1001 postpartum day # 1  1. Continue routine postpartum care 2. O positive, Rubella Immune, Varicella Immune 3. TDAP given antepartum 4. Breastfeeding/Contraception: progesterone-only pills 5. Disposition: may want to discharge later today pending postpartum/newborn progress    Tresea MallJane Palestine Mosco, CNM

## 2018-06-24 LAB — RPR: RPR Ser Ql: NONREACTIVE

## 2018-06-24 MED ORDER — NORETHINDRONE 0.35 MG PO TABS: 1.0000 | ORAL_TABLET | Freq: Every day | ORAL | 11 refills | Status: DC

## 2018-06-24 NOTE — Progress Notes (Signed)
Discharge order received from doctor. Reviewed discharge instructions and prescriptions with patient and answered all questions. Follow up appointment instructions given. Patient verbalized understanding. ID bands checked. Patient discharged home with infant via wheelchair by nursing/auxillary.    Kista Robb Garner, RN  

## 2018-06-24 NOTE — Discharge Instructions (Signed)
Please call your doctor or return to the ER if you experience any chest pains, shortness of breath, dizziness, visual changes, fever greater than 101, any heavy bleeding (saturating more than 1 pad per hour), large clots, or foul smelling discharge, any worsening abdominal pain and cramping that is not controlled by pain medication, or any signs of postpartum depression. No tampons, enemas, douches, or sexual intercourse for 6 weeks. Also avoid tub baths, hot tubs, or swimming for 6 weeks.  °

## 2018-08-01 ENCOUNTER — Ambulatory Visit (INDEPENDENT_AMBULATORY_CARE_PROVIDER_SITE_OTHER): Payer: BLUE CROSS/BLUE SHIELD | Admitting: Maternal Newborn

## 2018-08-01 ENCOUNTER — Encounter: Payer: Self-pay | Admitting: Maternal Newborn

## 2018-08-01 DIAGNOSIS — O872 Hemorrhoids in the puerperium: Secondary | ICD-10-CM

## 2018-08-01 DIAGNOSIS — Z30011 Encounter for initial prescription of contraceptive pills: Secondary | ICD-10-CM

## 2018-08-01 MED ORDER — NORGESTIMATE-ETH ESTRADIOL 0.25-35 MG-MCG PO TABS: 1.0000 | ORAL_TABLET | Freq: Every day | ORAL | 11 refills | Status: DC

## 2018-08-01 NOTE — Progress Notes (Signed)
Obstetrics & Gynecology Office Visit   Chief Complaint:  Chief Complaint  Patient presents with  . Postpartum Care    6wpp, some pain on LUQ and lower abdomin at times. wants to stay with pills as bc    History of Present Illness:  Date of delivery: 06/22/2018 Type of delivery: Term Vaginal  Pt had the following problems during pregnancy or labor: Proteinuria and Gestational Hypertension Patient has had what problems since the delivery: hemorrhoids with some blood in her stool and occasional abdominal pain  Newborn Details:  SINGLETON :  Birth weight: 7 lbs., 6 oz.   Maternal Details:  Feeding plan: Formula Post partum depression/anxiety noted None Edinburgh Post-Partum Depression Score: 0 Date of last PAP: 1//11/2017  Review of Systems  Constitutional: Negative for chills and fever.  HENT: Negative.   Eyes: Negative.   Respiratory: Negative for cough, shortness of breath and wheezing.   Cardiovascular: Negative for chest pain and palpitations.  Gastrointestinal: Positive for abdominal pain and blood in stool. Negative for constipation, heartburn, nausea and vomiting.  Genitourinary: Negative.   Musculoskeletal: Negative.   Skin: Negative.   Neurological: Negative.   Endo/Heme/Allergies: Negative.   Psychiatric/Behavioral: Negative for depression. The patient is not nervous/anxious.   All other systems reviewed and are negative.   Past Medical History:  Past Medical History:  Diagnosis Date  . Abnormal Pap smear of cervix 12/04/2013   [facility] - pos hpv  . History of Papanicolaou smear of cervix 12/04/2013; [id number]   [facility] hpv +; neg -/-/-    Past Surgical History:  Past Surgical History:  Procedure Laterality Date  . TYMPANOSTOMY TUBE PLACEMENT    . WISDOM TOOTH EXTRACTION      Gynecologic History: No LMP recorded.  Obstetric History: [id number]  Family History:  History reviewed. No pertinent family history.  Social History:  Social History    Socioeconomic History  . Marital status: Married    Spouse name: Not on file  . Number of children: Not on file  . Years of education: 35  . Highest education level: Not on file  Occupational History  . Not on file  Social Needs  . Financial resource strain: Not on file  . Food insecurity:    Worry: Not on file    Inability: Not on file  . Transportation needs:    Medical: Not on file    Non-medical: Not on file  Tobacco Use  . Smoking status: Never Smoker  . Smokeless tobacco: Never Used  Substance and Sexual Activity  . Alcohol use: No    Frequency: Never    Comment: former  . Drug use: No  . Sexual activity: Yes    Birth control/protection: None  Lifestyle  . Physical activity:    Days per week: Not on file    Minutes per session: Not on file  . Stress: Not on file  Relationships  . Social connections:    Talks on phone: Not on file    Gets together: Not on file    Attends religious service: Not on file    Active member of club or organization: Not on file    Attends meetings of clubs or organizations: Not on file    Relationship status: Not on file  . Intimate partner violence:    Fear of current or ex partner: Not on file    Emotionally abused: Not on file    Physically abused: Not on file    Forced sexual  activity: Not on file  Other Topics Concern  . Not on file  Social History Narrative  . Not on file    Allergies:  No Known Allergies  Medications: Prior to Admission medications   Medication Sig Start Date End Date Taking? Authorizing Provider  Prenatal Vit-Fe Fumarate-FA (MULTIVITAMIN-PRENATAL) 27-0.8 MG TABS tablet Take 1 tablet by mouth daily at 12 noon.   Yes [provider]  norethindrone (MICRONOR,CAMILA,ERRIN) 0.35 MG tablet Take 1 tablet (0.35 mg total) by mouth daily. Patient not taking: Reported on 08/01/2018 06/24/18   Oswaldo Conroy, CNM    Physical Exam Vitals:  Vitals:   08/01/18 0917  BP: 104/68  Pulse: 97   No  LMP recorded.  General: NAD HEENT: normocephalic, anicteric Pulmonary: No increased work of breathing Abdomen: No hepatomegaly, splenomegaly or masses palpable. No evidence of hernia. Mild tenderness to palpation midline below umbilicus Genitourinary:  External: Normal external female genitalia.  Normal urethral  meatus, normal Bartholin's and Skene's glands.    Vagina: Normal vaginal mucosa, no evidence of prolapse.    Cervix: deferred after shared decision making, no  symptoms and Pap not due  Uterus: Non-enlarged, mobile, normal contour.  No CMT  Adnexa: ovaries non-enlarged, no adnexal masses  Rectal: deferred  Lymphatic: no evidence of inguinal lymphadenopathy Extremities: no edema, erythema, or tenderness Neurologic: Grossly intact Psychiatric: mood appropriate, affect full  Female chaperone present for pelvic and breast  portions of the physical exam  Assessment: 27 y.o. [id number] for postpartum exam and contraception,    Plan: Problem List Items Addressed This Visit      Other   Postpartum care following vaginal delivery - Primary    Other Visit Diagnoses    Hemorrhoids during puerperium       Encounter for initial prescription of contraceptive pills       Relevant Medications   norgestimate-ethinyl estradiol (ORTHO-CYCLEN,SPRINTEC,PREVIFEM) 0.25-35 MG-MCG tablet      1) Pap smear - up to date 2) Contraception: No longer breastfeeding, postpartum BP wnl, switch to combined OCP. 3) Advised to use OTC cream or suppository for relief of hemorrhoids. 4) Return in 1 year for annual exam or sooner if abdominal pain presists.  Marcelyn Bruins, CNM 08/01/2018  10:07 AM

## 2018-09-11 ENCOUNTER — Telehealth: Payer: Self-pay

## 2018-09-11 NOTE — Telephone Encounter (Signed)
Pt needs forms to return to work on Monday 18th. She states she dropped them off not sure if its FMLA or does she just need a return to work note. Called pt left message to return call

## 2018-09-11 NOTE — Telephone Encounter (Signed)
Pt states its fmla papers she needs so she can return to work Monday

## 2018-09-16 ENCOUNTER — Telehealth: Payer: Self-pay

## 2018-09-16 NOTE — Telephone Encounter (Signed)
Please have him sign a letter stating she can go back to work today. Thank you!

## 2018-09-16 NOTE — Telephone Encounter (Signed)
Pt started back to work today.  Please resubmit work note c today's date.  [phone]

## 2018-09-17 ENCOUNTER — Encounter: Payer: Self-pay | Admitting: Advanced Practice Midwife

## 2018-09-17 NOTE — Telephone Encounter (Signed)
Carolyn Wheeler delivered pt according to d/c papers. fwding to her

## 2018-09-17 NOTE — Telephone Encounter (Signed)
Letter written for patient with correct return to work date.

## 2018-11-07 DIAGNOSIS — H6503 Acute serous otitis media, bilateral: Secondary | ICD-10-CM | POA: Diagnosis not present

## 2018-11-07 DIAGNOSIS — J020 Streptococcal pharyngitis: Secondary | ICD-10-CM | POA: Diagnosis not present

## 2018-11-07 DIAGNOSIS — R5383 Other fatigue: Secondary | ICD-10-CM | POA: Diagnosis not present

## 2018-12-04 DIAGNOSIS — J020 Streptococcal pharyngitis: Secondary | ICD-10-CM | POA: Diagnosis not present

## 2019-03-10 DIAGNOSIS — R509 Fever, unspecified: Secondary | ICD-10-CM | POA: Diagnosis not present

## 2019-03-10 DIAGNOSIS — R05 Cough: Secondary | ICD-10-CM | POA: Diagnosis not present

## 2019-03-10 DIAGNOSIS — B349 Viral infection, unspecified: Secondary | ICD-10-CM | POA: Diagnosis not present

## 2020-02-11 ENCOUNTER — Other Ambulatory Visit (HOSPITAL_COMMUNITY)
Admission: RE | Admit: 2020-02-11 | Discharge: 2020-02-11 | Disposition: A | Discharge status: Home / Self Care | Payer: BLUE CROSS/BLUE SHIELD | Source: Ambulatory Visit | Attending: Obstetrics and Gynecology | Admitting: Obstetrics and Gynecology

## 2020-02-11 ENCOUNTER — Other Ambulatory Visit: Payer: Self-pay

## 2020-02-11 ENCOUNTER — Encounter: Payer: Self-pay | Admitting: Obstetrics and Gynecology

## 2020-02-11 ENCOUNTER — Ambulatory Visit (INDEPENDENT_AMBULATORY_CARE_PROVIDER_SITE_OTHER): Payer: BC Managed Care – PPO | Admitting: Obstetrics and Gynecology

## 2020-02-11 VITALS — BP 100/70 | Ht 66.0 in | Wt 166.0 lb

## 2020-02-11 DIAGNOSIS — Z124 Encounter for screening for malignant neoplasm of cervix: Secondary | ICD-10-CM

## 2020-02-11 DIAGNOSIS — B977 Papillomavirus as the cause of diseases classified elsewhere: Secondary | ICD-10-CM

## 2020-02-11 DIAGNOSIS — Z3169 Encounter for other general counseling and advice on procreation: Secondary | ICD-10-CM

## 2020-02-11 DIAGNOSIS — IMO0002 Reserved for concepts with insufficient information to code with codable children: Secondary | ICD-10-CM | POA: Insufficient documentation

## 2020-02-11 DIAGNOSIS — Z01419 Encounter for gynecological examination (general) (routine) without abnormal findings: Secondary | ICD-10-CM | POA: Diagnosis not present

## 2020-02-11 NOTE — Progress Notes (Signed)
PCP:  Patient, No Pcp Per   Chief Complaint  Patient presents with  . Gynecologic Exam     HPI:      Ms. Carolyn Wheeler is a 29 y.o. [id number] who LMP was Patient's last menstrual period was 01/31/2020 (exact date)., presents today for her annual examination.  Her menses are regular every 28-30 days, lasting 3-5 days.  Dysmenorrhea mild. She does not have intermenstrual bleeding.  Sex activity: single partner, contraception - none. Conception ok. Hasn't started PNVs yet but plans to Last Pap: 10/31/17  Results were: no abnormalities . Hx of HPV DNA on pap in 2015; repeat paps normal.  Hx of STDs: HPV on pap  There is no FH of breast cancer. There is no FH of ovarian cancer. The patient does not do self-breast exams.  Tobacco use: The patient denies current or previous tobacco use. Alcohol use: none No drug use.  Exercise: moderately active  She does get adequate calcium but not Vitamin D in her diet. Has lost 15# with diet/exrercise changes. Has plateaued currently but is still trying to lose wt.   Past Medical History:  Diagnosis Date  . Abnormal Pap smear of cervix 12/04/2013   [facility] - pos hpv  . History of Papanicolaou smear of cervix 12/04/2013; [id number]   [facility] hpv +; neg -/-/-    Past Surgical History:  Procedure Laterality Date  . TYMPANOSTOMY TUBE PLACEMENT    . WISDOM TOOTH EXTRACTION      History reviewed. No pertinent family history.  Social History   Socioeconomic History  . Marital status: Married    Spouse name: Not on file  . Number of children: Not on file  . Years of education: 30  . Highest education level: Not on file  Occupational History  . Not on file  Tobacco Use  . Smoking status: Never Smoker  . Smokeless tobacco: Never Used  Substance and Sexual Activity  . Alcohol use: No    Comment: former  . Drug use: No  . Sexual activity: Yes    Birth control/protection: None  Other Topics Concern  . Not on file  Social History  Narrative  . Not on file   Social Determinants of Health   Financial Resource Strain:   . Difficulty of Paying Living Expenses:   Food Insecurity:   . Worried About Charity fundraiser in the Last Year:   . Arboriculturist in the Last Year:   Transportation Needs:   . Film/video editor (Medical):   Marland Kitchen Lack of Transportation (Non-Medical):   Physical Activity:   . Days of Exercise per Week:   . Minutes of Exercise per Session:   Stress:   . Feeling of Stress :   Social Connections:   . Frequency of Communication with Friends and Family:   . Frequency of Social Gatherings with Friends and Family:   . Attends Religious Services:   . Active Member of Clubs or Organizations:   . Attends Archivist Meetings:   Marland Kitchen Marital Status:   Intimate Partner Violence:   . Fear of Current or Ex-Partner:   . Emotionally Abused:   Marland Kitchen Physically Abused:   . Sexually Abused:     No current outpatient medications on file.     ROS:  Review of Systems  Constitutional: Negative for fatigue, fever and unexpected weight change.  Respiratory: Negative for cough, shortness of breath and wheezing.   Cardiovascular: Negative for chest pain,  palpitations and leg swelling.  Gastrointestinal: Negative for blood in stool, constipation, diarrhea, nausea and vomiting.  Endocrine: Negative for cold intolerance, heat intolerance and polyuria.  Genitourinary: Negative for dyspareunia, dysuria, flank pain, frequency, genital sores, hematuria, menstrual problem, pelvic pain, urgency, vaginal bleeding, vaginal discharge and vaginal pain.  Musculoskeletal: Negative for back pain, joint swelling and myalgias.  Skin: Negative for rash.  Neurological: Negative for dizziness, syncope, light-headedness, numbness and headaches.  Hematological: Negative for adenopathy.  Psychiatric/Behavioral: Negative for agitation, confusion, sleep disturbance and suicidal ideas. The patient is not nervous/anxious.    BREAST: No symptoms   Objective: BP 100/70   Ht 5\' 6"  (1.676 m)   Wt 166 lb (75.3 kg)   LMP 01/31/2020 (Exact Date)   Breastfeeding No   BMI 26.79 kg/m    Physical Exam Constitutional:      Appearance: She is well-developed.  Genitourinary:     Vulva, vagina, cervix, uterus, right adnexa and left adnexa normal.     No vulval lesion or tenderness noted.     No vaginal discharge, erythema or tenderness.     No cervical polyp.     Uterus is not enlarged or tender.     No right or left adnexal mass present.     Right adnexa not tender.     Left adnexa not tender.  Neck:     Thyroid: No thyromegaly.  Cardiovascular:     Rate and Rhythm: Normal rate and regular rhythm.     Heart sounds: Normal heart sounds. No murmur.  Pulmonary:     Effort: Pulmonary effort is normal.     Breath sounds: Normal breath sounds.  Chest:     Breasts:        Right: No mass, nipple discharge, skin change or tenderness.        Left: No mass, nipple discharge, skin change or tenderness.  Abdominal:     Palpations: Abdomen is soft.     Tenderness: There is no abdominal tenderness. There is no guarding.  Musculoskeletal:        General: Normal range of motion.     Cervical back: Normal range of motion.  Neurological:     General: No focal deficit present.     Mental Status: She is alert and oriented to person, place, and time.     Cranial Nerves: No cranial nerve deficit.  Skin:    General: Skin is warm and dry.  Psychiatric:        Mood and Affect: Mood normal.        Behavior: Behavior normal.        Thought Content: Thought content normal.        Judgment: Judgment normal.  Vitals reviewed.     Assessment/Plan: Encounter for annual routine gynecological examination  Cervical cancer screening - Plan: Cytology - PAP  Human papilloma virus (HPV) DNA test positive - Plan: Cytology - PAP  Pre-conception counseling--start PNVs. F/u prn NOB            GYN counsel adequate intake of  calcium and vitamin D, diet and exercise     F/U  Return in about 1 year (around 02/10/2021).  Aleida Crandell B. Giovanni Bath, PA-C 02/11/2020 10:14 AM

## 2020-02-11 NOTE — Patient Instructions (Signed)
I value your feedback and entrusting us with your care. If you get a [facility] patient survey, I would appreciate you taking the time to let us know about your experience today. Thank you!  As of October 09, 2019, your lab results will be released to your MyChart immediately, before I even have a chance to see them. Please give me time to review them and contact you if there are any abnormalities. Thank you for your patience.  

## 2020-02-12 LAB — CYTOLOGY - PAP: Diagnosis: NEGATIVE

## 2020-03-09 DIAGNOSIS — B349 Viral infection, unspecified: Secondary | ICD-10-CM | POA: Diagnosis not present

## 2020-03-09 DIAGNOSIS — Z20822 Contact with and (suspected) exposure to covid-19: Secondary | ICD-10-CM | POA: Diagnosis not present

## 2020-06-16 DIAGNOSIS — Z1152 Encounter for screening for COVID-19: Secondary | ICD-10-CM | POA: Diagnosis not present

## 2020-06-16 DIAGNOSIS — J019 Acute sinusitis, unspecified: Secondary | ICD-10-CM | POA: Diagnosis not present

## 2020-07-28 DIAGNOSIS — U071 COVID-19: Secondary | ICD-10-CM | POA: Diagnosis not present

## 2020-12-14 DIAGNOSIS — J020 Streptococcal pharyngitis: Secondary | ICD-10-CM | POA: Diagnosis not present

## 2020-12-14 DIAGNOSIS — Z20822 Contact with and (suspected) exposure to covid-19: Secondary | ICD-10-CM | POA: Diagnosis not present

## 2020-12-14 DIAGNOSIS — Z1152 Encounter for screening for COVID-19: Secondary | ICD-10-CM | POA: Diagnosis not present

## 2020-12-14 DIAGNOSIS — J3489 Other specified disorders of nose and nasal sinuses: Secondary | ICD-10-CM | POA: Diagnosis not present

## 2021-02-14 ENCOUNTER — Other Ambulatory Visit (HOSPITAL_COMMUNITY)
Admission: RE | Admit: 2021-02-14 | Discharge: 2021-02-14 | Disposition: A | Discharge status: Home / Self Care | Payer: Self-pay | Source: Ambulatory Visit | Attending: Obstetrics and Gynecology | Admitting: Obstetrics and Gynecology

## 2021-02-14 ENCOUNTER — Ambulatory Visit (INDEPENDENT_AMBULATORY_CARE_PROVIDER_SITE_OTHER): Payer: BC Managed Care – PPO | Admitting: Obstetrics and Gynecology

## 2021-02-14 ENCOUNTER — Other Ambulatory Visit: Payer: Self-pay

## 2021-02-14 ENCOUNTER — Encounter: Payer: Self-pay | Admitting: Obstetrics and Gynecology

## 2021-02-14 VITALS — BP 100/72 | Ht 66.0 in | Wt 175.0 lb

## 2021-02-14 DIAGNOSIS — E162 Hypoglycemia, unspecified: Secondary | ICD-10-CM | POA: Diagnosis not present

## 2021-02-14 DIAGNOSIS — F3281 Premenstrual dysphoric disorder: Secondary | ICD-10-CM | POA: Diagnosis not present

## 2021-02-14 DIAGNOSIS — Z1151 Encounter for screening for human papillomavirus (HPV): Secondary | ICD-10-CM | POA: Insufficient documentation

## 2021-02-14 DIAGNOSIS — Z124 Encounter for screening for malignant neoplasm of cervix: Secondary | ICD-10-CM

## 2021-02-14 DIAGNOSIS — R42 Dizziness and giddiness: Secondary | ICD-10-CM | POA: Diagnosis not present

## 2021-02-14 DIAGNOSIS — Z131 Encounter for screening for diabetes mellitus: Secondary | ICD-10-CM | POA: Diagnosis not present

## 2021-02-14 DIAGNOSIS — Z01419 Encounter for gynecological examination (general) (routine) without abnormal findings: Secondary | ICD-10-CM

## 2021-02-14 MED ORDER — SERTRALINE HCL 50 MG PO TABS: 50.0000 mg | ORAL_TABLET | Freq: Every day | ORAL | 5 refills | Status: DC

## 2021-02-14 NOTE — Progress Notes (Signed)
PCP:  Patient, No Pcp Per (Inactive)   Chief Complaint  Patient presents with  . Gynecologic Exam    No concerns     HPI:      Ms. Carolyn Wheeler is a 30 y.o. [id number] who LMP was No LMP recorded., presents today for her annual examination.  Her menses are regular every 28-30 days, lasting 4-6 days.  Dysmenorrhea mod, not improved with NSAIDs. Worse this yr. She does not have intermenstrual bleeding. Having worsening PMDD sx before menses. Has been under increased stress but sx are bad.   Sex activity: single partner, contraception - none. Conception ok. Declines BC for now.  Last Pap: 10/31/17  Results were: no abnormalities . Hx of HPV DNA on pap in 2015; repeat paps normal.  Hx of STDs: HPV on pap  There is no FH of breast cancer. There is no FH of ovarian cancer. The patient does not do self-breast exams.  Tobacco use: The patient denies current or previous tobacco use. Alcohol use: none No drug use.  Exercise: moderately active  She does get adequate calcium and Vitamin D in her diet.  Has had issues with dizziness/arms going numb and feeling weird when drinks alcohol with sugary juice. No sx with other alcohol use, doesn't drink sugary drinks usually. No hx of DM, no hx of gestational DM.    Past Medical History:  Diagnosis Date  . Abnormal Pap smear of cervix 12/04/2013   [facility] - pos hpv  . History of Papanicolaou smear of cervix 12/04/2013; [id number]   [facility] hpv +; neg -/-/-    Past Surgical History:  Procedure Laterality Date  . TYMPANOSTOMY TUBE PLACEMENT    . WISDOM TOOTH EXTRACTION      History reviewed. No pertinent family history.  Social History   Socioeconomic History  . Marital status: Married    Spouse name: Not on file  . Number of children: Not on file  . Years of education: 64  . Highest education level: Not on file  Occupational History  . Not on file  Tobacco Use  . Smoking status: Never Smoker  . Smokeless tobacco: Never Used   Vaping Use  . Vaping Use: Never used  Substance and Sexual Activity  . Alcohol use: No    Comment: former  . Drug use: No  . Sexual activity: Yes    Birth control/protection: None  Other Topics Concern  . Not on file  Social History Narrative  . Not on file   Social Determinants of Health   Financial Resource Strain: Not on file  Food Insecurity: Not on file  Transportation Needs: Not on file  Physical Activity: Not on file  Stress: Not on file  Social Connections: Not on file  Intimate Partner Violence: Not on file     Current Outpatient Medications:  .  sertraline (ZOLOFT) 50 MG tablet, Take 1 tablet (50 mg total) by mouth daily., Disp: 30 tablet, Rfl: 5     ROS:  Review of Systems  Constitutional: Negative for fatigue, fever and unexpected weight change.  Respiratory: Negative for cough, shortness of breath and wheezing.   Cardiovascular: Negative for chest pain, palpitations and leg swelling.  Gastrointestinal: Negative for blood in stool, constipation, diarrhea, nausea and vomiting.  Endocrine: Negative for cold intolerance, heat intolerance and polyuria.  Genitourinary: Negative for dyspareunia, dysuria, flank pain, frequency, genital sores, hematuria, menstrual problem, pelvic pain, urgency, vaginal bleeding, vaginal discharge and vaginal pain.  Musculoskeletal: Negative for back  pain, joint swelling and myalgias.  Skin: Negative for rash.  Neurological: Positive for dizziness and numbness. Negative for syncope, light-headedness and headaches.  Hematological: Negative for adenopathy.  Psychiatric/Behavioral: Negative for agitation, confusion, sleep disturbance and suicidal ideas. The patient is not nervous/anxious.   BREAST: No symptoms   Objective: BP 100/72   Ht 5\' 6"  (1.676 m)   Wt 175 lb (79.4 kg)   BMI 28.25 kg/m    Physical Exam Constitutional:      Appearance: She is well-developed.  Genitourinary:     Vulva normal.     Right Labia: No  rash, tenderness or lesions.    Left Labia: No tenderness, lesions or rash.    No vaginal discharge, erythema or tenderness.      Right Adnexa: not tender and no mass present.    Left Adnexa: not tender and no mass present.    No cervical friability or polyp.     Uterus is not enlarged or tender.  Breasts:     Right: No mass, nipple discharge, skin change or tenderness.     Left: No mass, nipple discharge, skin change or tenderness.    Neck:     Thyroid: No thyromegaly.  Cardiovascular:     Rate and Rhythm: Normal rate and regular rhythm.     Heart sounds: Normal heart sounds. No murmur heard.   Pulmonary:     Effort: Pulmonary effort is normal.     Breath sounds: Normal breath sounds.  Abdominal:     Palpations: Abdomen is soft.     Tenderness: There is no abdominal tenderness. There is no guarding or rebound.  Musculoskeletal:        General: Normal range of motion.     Cervical back: Normal range of motion.  Lymphadenopathy:     Cervical: No cervical adenopathy.  Neurological:     General: No focal deficit present.     Mental Status: She is alert and oriented to person, place, and time.     Cranial Nerves: No cranial nerve deficit.  Skin:    General: Skin is warm and dry.  Psychiatric:        Mood and Affect: Mood normal.        Behavior: Behavior normal.        Thought Content: Thought content normal.        Judgment: Judgment normal.  Vitals reviewed.     Assessment/Plan: Encounter for annual routine gynecological examination  Cervical cancer screening - Plan: Cytology - PAP  Screening for HPV (human papillomavirus) - Plan: Cytology - PAP  PMDD (premenstrual dysphoric disorder) - Plan: sertraline (ZOLOFT) 50 MG tablet; discussed OCPs vs SSRIs. Pt would like to try SSRI. Rx zoloft during luteal phase. Sx may improve with stress changes with new job. F/u prn.   Hypoglycemia - Plan: Comprehensive metabolic panel, Hemoglobin A1c; chck labs. If WNL, could be  hypoglycemia. Rule out pre-DM. Avoid sugary drinks.   Dizziness - Plan: Comprehensive metabolic panel, Hemoglobin A1c  Screening for diabetes mellitus - Plan: Comprehensive metabolic panel, Hemoglobin A1c   Meds ordered this encounter  Medications  . sertraline (ZOLOFT) 50 MG tablet    Sig: Take 1 tablet (50 mg total) by mouth daily.    Dispense:  30 tablet    Refill:  5    Order Specific Question:   Supervising Provider    Answer:   Nadara Mustard  GYN counsel adequate intake of calcium and vitamin D, diet and exercise     F/U  Return in about 1 year (around 02/14/2022).  Sammy Cassar B. Ambriana Selway, PA-C 02/14/2021 4:39 PM

## 2021-02-14 NOTE — Patient Instructions (Signed)
I value your feedback and you entrusting us with your care. If you get a [facility] patient survey, I would appreciate you taking the time to let us know about your experience today. Thank you! ? ? ?

## 2021-02-15 LAB — COMPREHENSIVE METABOLIC PANEL
ALT: 21 IU/L (ref 0–32)
AST: 23 IU/L (ref 0–40)
Albumin/Globulin Ratio: 1.5 (ref 1.2–2.2)
Albumin: 4.5 g/dL (ref 3.9–5.0)
Alkaline Phosphatase: 86 IU/L (ref 44–121)
BUN/Creatinine Ratio: 23 (ref 9–23)
BUN: 14 mg/dL (ref 6–20)
Bilirubin Total: <0.2 mg/dL (ref 0.0–1.2)
CO2: 20 mmol/L (ref 20–29)
Calcium: 9.4 mg/dL (ref 8.7–10.2)
Chloride: 103 mmol/L (ref 96–106)
Creatinine, Ser: 0.61 mg/dL (ref 0.57–1.00)
Globulin, Total: 3 g/dL (ref 1.5–4.5)
Glucose: 93 mg/dL (ref 65–99)
Potassium: 5 mmol/L (ref 3.5–5.2)
Sodium: 140 mmol/L (ref 134–144)
Total Protein: 7.5 g/dL (ref 6.0–8.5)
eGFR: 123 mL/min/{1.73_m2} (ref >59)

## 2021-02-15 LAB — HEMOGLOBIN A1C
Est. average glucose Bld gHb Est-mCnc: 108 mg/dL
Hgb A1c MFr Bld: 5.4 % (ref 4.8–5.6)

## 2021-02-15 NOTE — Progress Notes (Signed)
Pls let pt know labs normal. No pre-diabetes. If has hypoglycemia, we can't detect that on labs like this. She can do finger stick during symptoms. With that said, since sx only occur with drinking sugary juices, best to avoid them. F/u prn.

## 2021-02-15 NOTE — Progress Notes (Signed)
Pt aware.

## 2021-02-16 LAB — CYTOLOGY - PAP
Comment: NEGATIVE
Diagnosis: NEGATIVE
High risk HPV: NEGATIVE

## 2021-03-14 ENCOUNTER — Other Ambulatory Visit: Payer: Self-pay | Admitting: Obstetrics and Gynecology

## 2021-03-14 DIAGNOSIS — F3281 Premenstrual dysphoric disorder: Secondary | ICD-10-CM

## 2021-09-26 DIAGNOSIS — J069 Acute upper respiratory infection, unspecified: Secondary | ICD-10-CM | POA: Diagnosis not present

## 2021-11-03 DIAGNOSIS — N12 Tubulo-interstitial nephritis, not specified as acute or chronic: Secondary | ICD-10-CM | POA: Diagnosis not present

## 2021-11-03 DIAGNOSIS — K760 Fatty (change of) liver, not elsewhere classified: Secondary | ICD-10-CM | POA: Diagnosis not present

## 2021-11-03 DIAGNOSIS — Z3202 Encounter for pregnancy test, result negative: Secondary | ICD-10-CM | POA: Diagnosis not present

## 2021-11-03 DIAGNOSIS — R319 Hematuria, unspecified: Secondary | ICD-10-CM | POA: Diagnosis not present

## 2021-11-03 DIAGNOSIS — R9341 Abnormal radiologic findings on diagnostic imaging of renal pelvis, ureter, or bladder: Secondary | ICD-10-CM | POA: Diagnosis not present

## 2021-11-03 DIAGNOSIS — M549 Dorsalgia, unspecified: Secondary | ICD-10-CM | POA: Diagnosis not present

## 2021-11-03 DIAGNOSIS — N3289 Other specified disorders of bladder: Secondary | ICD-10-CM | POA: Diagnosis not present

## 2021-11-03 DIAGNOSIS — R109 Unspecified abdominal pain: Secondary | ICD-10-CM | POA: Diagnosis not present

## 2021-11-28 DIAGNOSIS — H10022 Other mucopurulent conjunctivitis, left eye: Secondary | ICD-10-CM | POA: Diagnosis not present

## 2021-12-24 DIAGNOSIS — R051 Acute cough: Secondary | ICD-10-CM | POA: Diagnosis not present

## 2021-12-24 DIAGNOSIS — H66002 Acute suppurative otitis media without spontaneous rupture of ear drum, left ear: Secondary | ICD-10-CM | POA: Diagnosis not present

## 2022-07-25 DIAGNOSIS — J069 Acute upper respiratory infection, unspecified: Secondary | ICD-10-CM | POA: Diagnosis not present

## 2022-07-25 DIAGNOSIS — Z20822 Contact with and (suspected) exposure to covid-19: Secondary | ICD-10-CM | POA: Diagnosis not present

## 2022-08-07 ENCOUNTER — Ambulatory Visit: Payer: BC Managed Care – PPO | Admitting: Family

## 2022-08-21 DIAGNOSIS — G5132 Clonic hemifacial spasm, left: Secondary | ICD-10-CM | POA: Diagnosis not present

## 2022-08-21 DIAGNOSIS — H04202 Unspecified epiphora, left lacrimal gland: Secondary | ICD-10-CM | POA: Diagnosis not present

## 2022-08-30 DIAGNOSIS — L700 Acne vulgaris: Secondary | ICD-10-CM | POA: Diagnosis not present

## 2022-08-31 DIAGNOSIS — E785 Hyperlipidemia, unspecified: Secondary | ICD-10-CM | POA: Diagnosis not present

## 2022-08-31 DIAGNOSIS — R531 Weakness: Secondary | ICD-10-CM | POA: Diagnosis not present

## 2022-08-31 DIAGNOSIS — L700 Acne vulgaris: Secondary | ICD-10-CM | POA: Diagnosis not present

## 2022-09-05 DIAGNOSIS — Z1331 Encounter for screening for depression: Secondary | ICD-10-CM | POA: Diagnosis not present

## 2022-09-05 DIAGNOSIS — Z3200 Encounter for pregnancy test, result unknown: Secondary | ICD-10-CM | POA: Diagnosis not present

## 2022-09-08 ENCOUNTER — Telehealth: Payer: Self-pay

## 2022-09-08 NOTE — Telephone Encounter (Signed)
I contacted patient via phone about scheduling. Patient is scheduled for blood work next Tuesday with PCP. Patient is going to check with them after seeingif her levels change. Patient asked if she needs ultrasound, I did explain to patient we are scheduling a couple of weeks out if she wants to see if her PCP feels like she needs ultrasound to follow up with them since they are following her care already. Patient is aware to call back or send mychart messages when are concerns we are her for her. Patient express appreciation and said she will be calling back next Tuesday with her news.

## 2022-09-08 NOTE — Telephone Encounter (Signed)
Patient contacted office stating that she has her OB visit scheduled for 10/02/22. Patient stated that she has a history of miscarriage and states that she wanted to be seen in office sooner. She reported that she was seen by physicians in [place] with complaints of cramping and bleeding a week ago which has since resolved and she states labs drawn a week ago stated that her hcg levels are still climbing but patient states that she would rather be seen sooner for evaluation to give her peace of mind. Patient request a call back today to reschedule to a sooner date at [phone]. KW

## 2022-09-12 DIAGNOSIS — Z3200 Encounter for pregnancy test, result unknown: Secondary | ICD-10-CM | POA: Diagnosis not present

## 2022-10-02 ENCOUNTER — Ambulatory Visit: Payer: BC Managed Care – PPO | Admitting: Obstetrics & Gynecology

## 2022-10-12 DIAGNOSIS — R112 Nausea with vomiting, unspecified: Secondary | ICD-10-CM | POA: Diagnosis not present

## 2022-10-12 DIAGNOSIS — K529 Noninfective gastroenteritis and colitis, unspecified: Secondary | ICD-10-CM | POA: Diagnosis not present

## 2023-01-22 NOTE — Progress Notes (Unsigned)
PCP:  Patient, No Pcp Per   No chief complaint on file.    HPI:      Ms. Carolyn Wheeler is a 32 y.o. [phone] who LMP was No LMP recorded., presents today for her annual examination.  Her menses are regular every 28-30 days, lasting 4-6 days.  Dysmenorrhea mod, not improved with NSAIDs. Worse this yr. She does not have intermenstrual bleeding. Having worsening PMDD sx before menses. Has been under increased stress but sx are bad.   Sex activity: single partner, contraception - none. Conception ok. Declines BC for now.  Last Pap: 02/14/21  Results were: no abnormalities /neg HPV DNA   Hx of STDs: HPV on pap 2015  There is no FH of breast cancer. There is no FH of ovarian cancer. The patient does not do self-breast exams.  Tobacco use: The patient denies current or previous tobacco use. Alcohol use: none No drug use.  Exercise: moderately active  She does get adequate calcium and Vitamin D in her diet.  Has had issues with dizziness/arms going numb and feeling weird when drinks alcohol with sugary juice. No sx with other alcohol use, doesn't drink sugary drinks usually. No hx of DM, no hx of gestational DM.    Past Medical History:  Diagnosis Date   Abnormal Pap smear of cervix 12/04/2013   [facility] - pos hpv   History of Papanicolaou smear of cervix 12/04/2013; [id number]   [facility] hpv +; neg -/-/-    Past Surgical History:  Procedure Laterality Date   TYMPANOSTOMY TUBE PLACEMENT     WISDOM TOOTH EXTRACTION      No family history on file.  Social History   Socioeconomic History   Marital status: Married    Spouse name: Not on file   Number of children: Not on file   Years of education: 12   Highest education level: Not on file  Occupational History   Not on file  Tobacco Use   Smoking status: Never   Smokeless tobacco: Never  Vaping Use   Vaping Use: Never used  Substance and Sexual Activity   Alcohol use: No    Comment: former   Drug use: No   Sexual  activity: Yes    Birth control/protection: None  Other Topics Concern   Not on file  Social History Narrative   Not on file   Social Determinants of Health   Financial Resource Strain: Not on file  Food Insecurity: Not on file  Transportation Needs: Not on file  Physical Activity: Not on file  Stress: Not on file  Social Connections: Not on file  Intimate Partner Violence: Not on file     Current Outpatient Medications:    sertraline (ZOLOFT) 50 MG tablet, Take 1 tablet (50 mg total) by mouth daily., Disp: 30 tablet, Rfl: 5     ROS:  Review of Systems  Constitutional:  Negative for fatigue, fever and unexpected weight change.  Respiratory:  Negative for cough, shortness of breath and wheezing.   Cardiovascular:  Negative for chest pain, palpitations and leg swelling.  Gastrointestinal:  Negative for blood in stool, constipation, diarrhea, nausea and vomiting.  Endocrine: Negative for cold intolerance, heat intolerance and polyuria.  Genitourinary:  Negative for dyspareunia, dysuria, flank pain, frequency, genital sores, hematuria, menstrual problem, pelvic pain, urgency, vaginal bleeding, vaginal discharge and vaginal pain.  Musculoskeletal:  Negative for back pain, joint swelling and myalgias.  Skin:  Negative for rash.  Neurological:  Positive for  dizziness and numbness. Negative for syncope, light-headedness and headaches.  Hematological:  Negative for adenopathy.  Psychiatric/Behavioral:  Negative for agitation, confusion, sleep disturbance and suicidal ideas. The patient is not nervous/anxious.   BREAST: No symptoms   Objective: There were no vitals taken for this visit.   Physical Exam Constitutional:      Appearance: She is well-developed.  Genitourinary:     Vulva normal.     Right Labia: No rash, tenderness or lesions.    Left Labia: No tenderness, lesions or rash.    No vaginal discharge, erythema or tenderness.      Right Adnexa: not tender and no  mass present.    Left Adnexa: not tender and no mass present.    No cervical friability or polyp.     Uterus is not enlarged or tender.  Breasts:    Right: No mass, nipple discharge, skin change or tenderness.     Left: No mass, nipple discharge, skin change or tenderness.  Neck:     Thyroid: No thyromegaly.  Cardiovascular:     Rate and Rhythm: Normal rate and regular rhythm.     Heart sounds: Normal heart sounds. No murmur heard. Pulmonary:     Effort: Pulmonary effort is normal.     Breath sounds: Normal breath sounds.  Abdominal:     Palpations: Abdomen is soft.     Tenderness: There is no abdominal tenderness. There is no guarding or rebound.  Musculoskeletal:        General: Normal range of motion.     Cervical back: Normal range of motion.  Lymphadenopathy:     Cervical: No cervical adenopathy.  Neurological:     General: No focal deficit present.     Mental Status: She is alert and oriented to person, place, and time.     Cranial Nerves: No cranial nerve deficit.  Skin:    General: Skin is warm and dry.  Psychiatric:        Mood and Affect: Mood normal.        Behavior: Behavior normal.        Thought Content: Thought content normal.        Judgment: Judgment normal.  Vitals reviewed.     Assessment/Plan: Encounter for annual routine gynecological examination  Cervical cancer screening - Plan: Cytology - PAP  Screening for HPV (human papillomavirus) - Plan: Cytology - PAP  PMDD (premenstrual dysphoric disorder) - Plan: sertraline (ZOLOFT) 50 MG tablet; discussed OCPs vs SSRIs. Pt would like to try SSRI. Rx zoloft during luteal phase. Sx may improve with stress changes with new job. F/u prn.   Hypoglycemia - Plan: Comprehensive metabolic panel, Hemoglobin A1c; chck labs. If WNL, could be hypoglycemia. Rule out pre-DM. Avoid sugary drinks.   Dizziness - Plan: Comprehensive metabolic panel, Hemoglobin A1c  Screening for diabetes mellitus - Plan: Comprehensive  metabolic panel, Hemoglobin A1c   No orders of the defined types were placed in this encounter.            GYN counsel adequate intake of calcium and vitamin D, diet and exercise     F/U  No follow-ups on file.  Gisele Pack B. Kapri Nero, PA-C 01/22/2023 7:33 PM

## 2023-01-23 ENCOUNTER — Ambulatory Visit (INDEPENDENT_AMBULATORY_CARE_PROVIDER_SITE_OTHER): Payer: No Typology Code available for payment source | Admitting: Obstetrics and Gynecology

## 2023-01-23 ENCOUNTER — Encounter: Payer: Self-pay | Admitting: Obstetrics and Gynecology

## 2023-01-23 VITALS — BP 110/80 | Ht 63.0 in | Wt 179.0 lb

## 2023-01-23 DIAGNOSIS — Z01411 Encounter for gynecological examination (general) (routine) with abnormal findings: Secondary | ICD-10-CM

## 2023-01-23 DIAGNOSIS — B3731 Acute candidiasis of vulva and vagina: Secondary | ICD-10-CM

## 2023-01-23 DIAGNOSIS — Z01419 Encounter for gynecological examination (general) (routine) without abnormal findings: Secondary | ICD-10-CM

## 2023-01-23 DIAGNOSIS — K5901 Slow transit constipation: Secondary | ICD-10-CM

## 2023-01-23 DIAGNOSIS — F3281 Premenstrual dysphoric disorder: Secondary | ICD-10-CM

## 2023-01-23 LAB — POCT WET PREP WITH KOH
Clue Cells Wet Prep HPF POC: NEGATIVE
KOH Prep POC: NEGATIVE
Trichomonas, UA: NEGATIVE
Yeast Wet Prep HPF POC: POSITIVE

## 2023-01-23 MED ORDER — FLUCONAZOLE 150 MG PO TABS: 150.0000 mg | ORAL_TABLET | Freq: Once | ORAL | 0 refills | Status: AC

## 2023-01-23 NOTE — Patient Instructions (Signed)
I value your feedback and you entrusting us with your care. If you get a  patient survey, I would appreciate you taking the time to let us know about your experience today. Thank you! ? ? ?

## 2023-01-29 ENCOUNTER — Encounter: Payer: Self-pay | Admitting: Obstetrics and Gynecology

## 2023-07-26 ENCOUNTER — Ambulatory Visit: Payer: No Typology Code available for payment source

## 2023-07-26 VITALS — BP 123/83 | HR 69 | Ht 62.0 in | Wt 177.0 lb

## 2023-07-26 DIAGNOSIS — Z3200 Encounter for pregnancy test, result unknown: Secondary | ICD-10-CM

## 2023-07-26 DIAGNOSIS — Z3201 Encounter for pregnancy test, result positive: Secondary | ICD-10-CM | POA: Diagnosis not present

## 2023-07-26 LAB — POCT URINE PREGNANCY: Preg Test, Ur: POSITIVE — AB

## 2023-07-26 NOTE — Progress Notes (Signed)
NURSE VISIT NOTE  Subjective:    Patient ID: Carolyn Wheeler, female    DOB: Jan 18, 1991, 32 y.o.   MRN: [id number]  HPI  Patient is a 32 y.o. [phone] female who presents for evaluation of amenorrhea. She believes she could be pregnant. Pregnancy is desired. Current symptoms also include: breast tenderness and nausea. Last period was abnormal.    Objective:    BP 123/83   Pulse 69   Ht 5\' 2"  (1.575 m)   Wt 177 lb (80.3 kg)   LMP 06/06/2023 (Exact Date)   BMI 32.37 kg/m   Lab Review  Results for orders placed or performed in visit on 07/26/23  POCT urine pregnancy  Result Value Ref Range   Preg Test, Ur Positive (A) Negative    Assessment:   1. Possible pregnancy, not confirmed     Plan:   Pregnancy Test: Positive  Encouraged well-balanced diet, plenty of rest when needed, pre-natal vitamins daily and walking for exercise.  She will schedule her nurse visit @ 7-[redacted] wks pregnant, u/s for dating @10  wk, and NOB visit at [redacted] wk pregnant.    Feel free to call with any questions.    Loney Laurence, CMA

## 2023-08-13 ENCOUNTER — Telehealth (INDEPENDENT_AMBULATORY_CARE_PROVIDER_SITE_OTHER): Payer: No Typology Code available for payment source

## 2023-08-13 VITALS — Wt 177.0 lb

## 2023-08-13 DIAGNOSIS — Z3689 Encounter for other specified antenatal screening: Secondary | ICD-10-CM

## 2023-08-13 DIAGNOSIS — Z3480 Encounter for supervision of other normal pregnancy, unspecified trimester: Secondary | ICD-10-CM | POA: Insufficient documentation

## 2023-08-13 DIAGNOSIS — Z369 Encounter for antenatal screening, unspecified: Secondary | ICD-10-CM

## 2023-08-13 NOTE — Progress Notes (Signed)
Unable to change office visit from my chart to telephone visit

## 2023-08-13 NOTE — Patient Instructions (Signed)
Second Trimester of Pregnancy  The second trimester of pregnancy is from week 13 through week 27. This is months 4 through 6 of pregnancy. The second trimester is often a time when you feel your best. Your body has adjusted to being pregnant, and you begin to feel better physically. During the second trimester: Morning sickness has lessened or stopped completely. You may have more energy. You may have an increase in appetite. The second trimester is also a time when the unborn baby (fetus) is growing rapidly. At the end of the sixth month, the fetus may be up to 12 inches long and weigh about 1 pounds. You will likely begin to feel the baby move (quickening) between 16 and 20 weeks of pregnancy. Body changes during your second trimester Your body continues to go through many changes during your second trimester. The changes vary and generally return to normal after the baby is born. Physical changes Your weight will continue to increase. You will notice your lower abdomen bulging out. You may begin to get stretch marks on your hips, abdomen, and breasts. Your breasts will continue to grow and to become tender. Dark spots or blotches (chloasma or mask of pregnancy) may develop on your face. A dark line from your belly button to the pubic area (linea nigra) may appear. You may have changes in your hair. These can include thickening of your hair, rapid growth, and changes in texture. Some people also have hair loss during or after pregnancy, or hair that feels dry or thin. Health changes You may develop headaches. You may have heartburn. You may develop constipation. You may develop hemorrhoids or swollen, bulging veins (varicose veins). Your gums may bleed and may be sensitive to brushing and flossing. You may urinate more often because the fetus is pressing on your bladder. You may have back pain. This is caused by: Weight gain. Pregnancy hormones that are relaxing the joints in your  pelvis. A shift in weight and the muscles that support your balance. Follow these instructions at home: Medicines Follow your health care provider's instructions regarding medicine use. Specific medicines may be either safe or unsafe to take during pregnancy. Do not take any medicines unless approved by your health care provider. Take a prenatal vitamin that contains at least 600 micrograms (mcg) of folic acid. Eating and drinking Eat a healthy diet that includes fresh fruits and vegetables, whole grains, good sources of protein such as meat, eggs, or tofu, and low-fat dairy products. Avoid raw meat and unpasteurized juice, milk, and cheese. These carry germs that can harm you and your baby. You may need to take these actions to prevent or treat constipation: Drink enough fluid to keep your urine pale yellow. Eat foods that are high in fiber, such as beans, whole grains, and fresh fruits and vegetables. Limit foods that are high in fat and processed sugars, such as fried or sweet foods. Activity Exercise only as directed by your health care provider. Most people can continue their usual exercise routine during pregnancy. Try to exercise for 30 minutes at least 5 days a week. Stop exercising if you develop contractions in your uterus. Stop exercising if you develop pain or cramping in the lower abdomen or lower back. Avoid exercising if it is very hot or humid or if you are at a high altitude. Avoid heavy lifting. If you choose to, you may have sex unless your health care provider tells you not to. Relieving pain and discomfort Wear a supportive  bra to prevent discomfort from breast tenderness. Take warm sitz baths to soothe any pain or discomfort caused by hemorrhoids. Use hemorrhoid cream if your health care provider approves. Rest with your legs raised (elevated) if you have leg cramps or low back pain. If you develop varicose veins: Wear support hose as told by your health care  provider. Elevate your feet for 15 minutes, 3-4 times a day. Limit salt in your diet. Safety Wear your seat belt at all times when driving or riding in a car. Talk with your health care provider if someone is verbally or physically abusive to you. Lifestyle Do not use hot tubs, steam rooms, or saunas. Do not douche. Do not use tampons or scented sanitary pads. Avoid cat litter boxes and soil used by cats. These carry germs that can cause birth defects in the baby and possibly loss of the fetus by miscarriage or stillbirth. Do not use herbal remedies, alcohol, illegal drugs, or medicines that are not approved by your health care provider. Chemicals in these products can harm your baby. Do not use any products that contain nicotine or tobacco, such as cigarettes, e-cigarettes, and chewing tobacco. If you need help quitting, ask your health care provider. General instructions During a routine prenatal visit, your health care provider will do a physical exam and other tests. He or she will also discuss your overall health. Keep all follow-up visits. This is important. Ask your health care provider for a referral to a local prenatal education class. Ask for help if you have counseling or nutritional needs during pregnancy. Your health care provider can offer advice or refer you to specialists for help with various needs. Where to find more information American Pregnancy Association: americanpregnancy.org [employer]: https://www.todd-brady.net/ Office on [employer] Health: MightyReward.co.nz Contact a health care provider if you have: A headache that does not go away when you take medicine. Vision changes or you see spots in front of your eyes. Mild pelvic cramps, pelvic pressure, or nagging pain in the abdominal area. Persistent nausea, vomiting, or diarrhea. A bad-smelling vaginal discharge or foul-smelling urine. Pain when you  urinate. Sudden or extreme swelling of your face, hands, ankles, feet, or legs. A fever. Get help right away if you: Have fluid leaking from your vagina. Have spotting or bleeding from your vagina. Have severe abdominal cramping or pain. Have difficulty breathing. Have chest pain. Have fainting spells. Have not felt your baby move for the time period told by your health care provider. Have new or increased pain, swelling, or redness in an arm or leg. Summary The second trimester of pregnancy is from week 13 through week 27 (months 4 through 6). Do not use herbal remedies, alcohol, illegal drugs, or medicines that are not approved by your health care provider. Chemicals in these products can harm your baby. Exercise only as directed by your health care provider. Most people can continue their usual exercise routine during pregnancy. Keep all follow-up visits. This is important. This information is not intended to replace advice given to you by your health care provider. Make sure you discuss any questions you have with your health care provider. Document Revised: 03/24/2020 Document Reviewed: 01/29/2020 Elsevier Patient Education  2024 [employer]. First Trimester of Pregnancy  The first trimester of pregnancy starts on the first day of your last menstrual period until the end of week 12. This is also called months 1 through 3 of pregnancy. Body changes during your first trimester Your  body goes through many changes during pregnancy. The changes usually return to normal after your baby is born. Physical changes You may gain or lose weight. Your breasts may grow larger and hurt. The area around your nipples may get darker. Dark spots or blotches may develop on your face. You may have changes in your hair. Health changes You may feel like you might vomit (nauseous), and you may vomit. You may have heartburn. You may have headaches. You may have trouble pooping (constipation). Your  gums may bleed. Other changes You may get tired easily. You may pee (urinate) more often. Your menstrual periods will stop. You may not feel hungry. You may want to eat certain kinds of food. You may have changes in your emotions from day to day. You may have more dreams. Follow these instructions at home: Medicines Take over-the-counter and prescription medicines only as told by your doctor. Some medicines are not safe during pregnancy. Take a prenatal vitamin that contains at least 600 micrograms (mcg) of folic acid. Eating and drinking Eat healthy meals that include: Fresh fruits and vegetables. Whole grains. Good sources of protein, such as meat, eggs, or tofu. Low-fat dairy products. Avoid raw meat and unpasteurized juice, milk, and cheese. If you feel like you may vomit, or you vomit: Eat 4 or 5 small meals a day instead of 3 large meals. Try eating a few soda crackers. Drink liquids between meals instead of during meals. You may need to take these actions to prevent or treat trouble pooping: Drink enough fluids to keep your pee (urine) pale yellow. Eat foods that are high in fiber. These include beans, whole grains, and fresh fruits and vegetables. Limit foods that are high in fat and sugar. These include fried or sweet foods. Activity Exercise only as told by your doctor. Most people can do their usual exercise routine during pregnancy. Stop exercising if you have cramps or pain in your lower belly (abdomen) or low back. Do not exercise if it is too hot or too humid, or if you are in a place of great height (high altitude). Avoid heavy lifting. If you choose to, you may have sex unless your doctor tells you not to. Relieving pain and discomfort Wear a good support bra if your breasts are sore. Rest with your legs raised (elevated) if you have leg cramps or low back pain. If you have bulging veins (varicose veins) in your legs: Wear support hose as told by your  doctor. Raise your feet for 15 minutes, 3-4 times a day. Limit salt in your food. Safety Wear your seat belt at all times when you are in a car. Talk with your doctor if someone is hurting you or yelling at you. Talk with your doctor if you are feeling sad or have thoughts of hurting yourself. Lifestyle Do not use hot tubs, steam rooms, or saunas. Do not douche. Do not use tampons or scented sanitary pads. Do not use herbal medicines, illegal drugs, or medicines that are not approved by your doctor. Do not drink alcohol. Do not smoke or use any products that contain nicotine or tobacco. If you need help quitting, ask your doctor. Avoid cat litter boxes and soil that is used by cats. These carry germs that can cause harm to the baby and can cause a loss of your baby by miscarriage or stillbirth. General instructions Keep all follow-up visits. This is important. Ask for help if you need counseling or if you need help with  nutrition. Your doctor can give you advice or tell you where to go for help. Visit your dentist. At home, brush your teeth with a soft toothbrush. Floss gently. Write down your questions. Take them to your prenatal visits. Where to find more information American Pregnancy Association: americanpregnancy.org [employer]: www.acog.org Office on Women's Health: MightyReward.co.nz Contact a doctor if: You are dizzy. You have a fever. You have mild cramps or pressure in your lower belly. You have a nagging pain in your belly area. You continue to feel like you may vomit, you vomit, or you have watery poop (diarrhea) for 24 hours or longer. You have a bad-smelling fluid coming from your vagina. You have pain when you pee. You are exposed to a disease that spreads from person to person, such as chickenpox, measles, Zika virus, HIV, or hepatitis. Get help right away if: You have spotting or bleeding from your vagina. You have  very bad belly cramping or pain. You have shortness of breath or chest pain. You have any kind of injury, such as from a fall or a car crash. You have new or increased pain, swelling, or redness in an arm or leg. Summary The first trimester of pregnancy starts on the first day of your last menstrual period until the end of week 12 (months 1 through 3). Eat 4 or 5 small meals a day instead of 3 large meals. Do not smoke or use any products that contain nicotine or tobacco. If you need help quitting, ask your doctor. Keep all follow-up visits. This information is not intended to replace advice given to you by your health care provider. Make sure you discuss any questions you have with your health care provider. Document Revised: 03/24/2020 Document Reviewed: 01/29/2020 Elsevier Patient Education  2024 [employer] Inc[employer] Commonly Asked Questions During Pregnancy  Cats: A parasite can be excreted in cat feces.  To avoid exposure you need to have another person empty the little box.  If you must empty the litter box you will need to wear gloves.  Wash your hands after handling your cat.  This parasite can also be found in raw or undercooked meat so this should also be avoided.  Colds, Sore Throats, Flu: Please check your medication sheet to see what you can take for symptoms.  If your symptoms are unrelieved by these medications please call the office.  Dental Work: Most any dental work Agricultural consultant recommends is permitted.  X-rays should only be taken during the first trimester if absolutely necessary.  Your abdomen should be shielded with a lead apron during all x-rays.  Please notify your provider prior to receiving any x-rays.  Novocaine is fine; gas is not recommended.  If your dentist requires a note from [place] prior to dental work please call the office and we will provide one for you.  Exercise: Exercise is an important part of staying healthy during your pregnancy.  You may continue most exercises  you were accustomed to prior to pregnancy.  Later in your pregnancy you will most likely notice you have difficulty with activities requiring balance like riding a bicycle.  It is important that you listen to your body and avoid activities that put you at a higher risk of falling.  Adequate rest and staying well hydrated are a must!  If you have questions about the safety of specific activities ask your provider.    Exposure to Children with illness: Try to avoid obvious exposure; report any  symptoms to [place] when noted,  If you have chicken pos, red measles or mumps, you should be immune to these diseases.   Please do not take any vaccines while pregnant unless you have checked with your OB provider.  Fetal Movement: After 28 weeks we recommend you do "kick counts" twice daily.  Lie or sit down in a calm quiet environment and count your baby movements "kicks".  You should feel your baby at least 10 times per hour.  If you have not felt 10 kicks within the first hour get up, walk around and have something sweet to eat or drink then repeat for an additional hour.  If count remains less than 10 per hour notify your provider.  Fumigating: Follow your pest control agent's advice as to how long to stay out of your home.  Ventilate the area well before re-entering.  Hemorrhoids:   Most over-the-counter preparations can be used during pregnancy.  Check your medication to see what is safe to use.  It is important to use a stool softener or fiber in your diet and to drink lots of liquids.  If hemorrhoids seem to be getting worse please call the office.   Hot Tubs:  Hot tubs Jacuzzis and saunas are not recommended while pregnant.  These increase your internal body temperature and should be avoided.  Intercourse:  Sexual intercourse is safe during pregnancy as long as you are comfortable, unless otherwise advised by your provider.  Spotting may occur after intercourse; report any bright red bleeding that is heavier  than spotting.  Labor:  If you know that you are in labor, please go to the hospital.  If you are unsure, please call the office and let us help you decide what to do.  Lifting, straining, etc:  If your job requires heavy lifting or straining please check with your provider for any limitations.  Generally, you should not lift items heavier than that you can lift simply with your hands and arms (no back muscles)  Painting:  Paint fumes do not harm your pregnancy, but may make you ill and should be avoided if possible.  Latex or water based paints have less odor than oils.  Use adequate ventilation while painting.  Permanents & Hair Color:  Chemicals in hair dyes are not recommended as they cause increase hair dryness which can increase hair loss during pregnancy.  " Highlighting" and permanents are allowed.  Dye may be absorbed differently and permanents may not hold as well during pregnancy.  Sunbathing:  Use a sunscreen, as skin burns easily during pregnancy.  Drink plenty of fluids; avoid over heating.  Tanning Beds:  Because their possible side effects are still unknown, tanning beds are not recommended.  Ultrasound Scans:  Routine ultrasounds are performed at approximately 20 weeks.  You will be able to see your baby's general anatomy an if you would like to know the gender this can usually be determined as well.  If it is questionable when you conceived you may also receive an ultrasound early in your pregnancy for dating purposes.  Otherwise ultrasound exams are not routinely performed unless there is a medical necessity.  Although you can request a scan we ask that you pay for it when conducted because insurance does not cover " patient request" scans.  Work: If your pregnancy proceeds without complications you may work until your due date, unless your physician or employer advises otherwise.  Round Ligament Pain/Pelvic Discomfort:  Sharp, shooting pains not associated  with bleeding are  fairly common, usually occurring in the second trimester of pregnancy.  They tend to be worse when standing up or when you remain standing for long periods of time.  These are the result of pressure of certain pelvic ligaments called "round ligaments".  Rest, Tylenol and heat seem to be the most effective relief.  As the womb and fetus grow, they rise out of the pelvis and the discomfort improves.  Please notify the office if your pain seems different than that described.  It may represent a more serious condition.  Common Medications Safe in Pregnancy  Acne:      Constipation:  Benzoyl Peroxide     Colace  Clindamycin      Dulcolax Suppository  Topica Erythromycin     Fibercon  Salicylic Acid      Metamucil         Miralax AVOID:        Senakot   Accutane    Cough:  Retin-A       Cough Drops  Tetracycline      Phenergan w/ Codeine if Rx  Minocycline      Robitussin (Plain & DM)  Antibiotics:     Crabs/Lice:  Ceclor       RID  Cephalosporins    AVOID:  E-Mycins      Kwell  Keflex  Macrobid/Macrodantin   Diarrhea:  Penicillin      Kao-Pectate  Zithromax      Imodium AD         PUSH FLUIDS AVOID:       Cipro     Fever:  Tetracycline      Tylenol (Regular or Extra  Minocycline       Strength)  Levaquin      Extra Strength-Do not          Exceed 8 tabs/24 hrs Caffeine:        200mg /day (equiv. To 1 cup of coffee or  approx. 3 12 oz sodas)         Gas: Cold/Hayfever:       Gas-X  Benadryl      Mylicon  Claritin       Phazyme  **Claritin-D        Chlor-Trimeton    Headaches:  Dimetapp      ASA-Free Excedrin  Drixoral-Non-Drowsy     Cold Compress  Mucinex (Guaifenasin)     Tylenol (Regular or Extra  Sudafed/Sudafed-12 Hour     Strength)  **Sudafed PE Pseudoephedrine   Tylenol Cold & Sinus     Vicks Vapor Rub  Zyrtec  **AVOID if Problems With Blood Pressure         Heartburn: Avoid lying down for at least 1 hour after meals  Aciphex      Maalox     Rash:  Milk of  Magnesia     Benadryl    Mylanta       1% Hydrocortisone Cream  Pepcid  Pepcid Complete   Sleep Aids:  Prevacid      Ambien   Prilosec       Benadryl  Rolaids       Chamomile Tea  Tums (Limit 4/day)     Unisom         Tylenol PM         Warm milk-add vanilla or  Hemorrhoids:       Sugar for taste  Anusol/Anusol H.C.  (RX: Analapram 2.5%)  Sugar Substitutes:  Hydrocortisone OTC     Ok in moderation  Preparation H      Tucks        Vaseline lotion applied to tissue with wiping    Herpes:     Throat:  Acyclovir      Oragel  Famvir  Valtrex     Vaccines:         Flu Shot Leg Cramps:       *Gardasil  Benadryl      Hepatitis A         Hepatitis B Nasal Spray:       Pneumovax  Saline Nasal Spray     Polio Booster         Tetanus Nausea:       Tuberculosis test or PPD  Vitamin B6 25 mg TID   AVOID:    Dramamine      *Gardasil  Emetrol       Live Poliovirus  Ginger Root 250 mg QID    MMR (measles, mumps &  High Complex Carbs @ Bedtime    rebella)  Sea Bands-Accupressure    Varicella (Chickenpox)  Unisom 1/2 tab TID     *No known complications           If received before Pain:         Known pregnancy;   Darvocet       Resume series after  Lortab        Delivery  Percocet    Yeast:   Tramadol      Femstat  Tylenol 3      Gyne-lotrimin  Ultram       Monistat  Vicodin           MISC:         All Sunscreens           Hair Coloring/highlights          Insect Repellant's          (Including DEET)         Mystic Tans

## 2023-08-13 NOTE — Progress Notes (Signed)
New OB Intake  I connected with  Carolyn Wheeler on 08/13/23 at  9:15 AM EDT by telephone Video Visit and verified that I am speaking with the correct person using two identifiers. Nurse is located at [facility] and pt is located at home.  I explained I am completing New OB Intake today. We discussed her EDD of 03/12/2024 that is based on LMP of 06/06/2023. Pt is G4/P2012. I reviewed her allergies, medications, Medical/Surgical/OB history, and appropriate screenings. There are no cats in the home. Based on history, this is a/an pregnancy uncomplicated .   Patient Active Problem List   Diagnosis Date Noted   Supervision of other normal pregnancy, antepartum 08/13/2023   Human papilloma virus (HPV) DNA test positive 02/11/2020   Postpartum care following vaginal delivery 06/22/2018    Concerns addressed today None  Delivery Plans:  Plans to deliver at [facility].  Anatomy US Explained first scheduled [place] will be scheduled soon and an anatomy scan will be done at 20 weeks.  Labs Discussed genetic screening with patient. Patient declines genetic testing to be drawn at new OB visit. Discussed possible labs to be drawn at new OB appointment.  COVID Vaccine Patient has had COVID vaccine.   Social Determinants of Health Food Insecurity: denies food insecurity Transportation: Patient denies transportation needs. Childcare: Discussed no children allowed at ultrasound appointments.   First visit review I reviewed new OB appt with pt. I explained she will have ob bloodwork and pap smear/pelvic exam if indicated. Explained pt will be seen by an AOB Provider at first visit; encounter routed to appropriate provider.   Loran Senters, [facility] 08/13/2023  9:45 AM

## 2023-08-20 ENCOUNTER — Ambulatory Visit
Admission: RE | Admit: 2023-08-20 | Discharge: 2023-08-20 | Disposition: A | Discharge status: Home / Self Care | Payer: No Typology Code available for payment source | Source: Ambulatory Visit | Attending: Certified Nurse Midwife | Admitting: Certified Nurse Midwife

## 2023-08-20 DIAGNOSIS — Z369 Encounter for antenatal screening, unspecified: Secondary | ICD-10-CM | POA: Insufficient documentation

## 2023-08-20 DIAGNOSIS — O208 Other hemorrhage in early pregnancy: Secondary | ICD-10-CM | POA: Diagnosis not present

## 2023-08-20 DIAGNOSIS — Z3480 Encounter for supervision of other normal pregnancy, unspecified trimester: Secondary | ICD-10-CM | POA: Insufficient documentation

## 2023-08-20 DIAGNOSIS — Z3A11 11 weeks gestation of pregnancy: Secondary | ICD-10-CM | POA: Diagnosis not present

## 2023-08-28 ENCOUNTER — Other Ambulatory Visit: Payer: Self-pay | Admitting: Certified Nurse Midwife

## 2023-08-28 DIAGNOSIS — Z369 Encounter for antenatal screening, unspecified: Secondary | ICD-10-CM

## 2023-08-28 DIAGNOSIS — Z3480 Encounter for supervision of other normal pregnancy, unspecified trimester: Secondary | ICD-10-CM

## 2023-09-10 ENCOUNTER — Ambulatory Visit (INDEPENDENT_AMBULATORY_CARE_PROVIDER_SITE_OTHER): Payer: No Typology Code available for payment source | Admitting: Licensed Practical Nurse

## 2023-09-10 ENCOUNTER — Encounter: Payer: Self-pay | Admitting: Licensed Practical Nurse

## 2023-09-10 ENCOUNTER — Other Ambulatory Visit (HOSPITAL_COMMUNITY)
Admission: RE | Admit: 2023-09-10 | Discharge: 2023-09-10 | Disposition: A | Discharge status: Home / Self Care | Payer: No Typology Code available for payment source | Source: Ambulatory Visit | Attending: Licensed Practical Nurse | Admitting: Licensed Practical Nurse

## 2023-09-10 VITALS — BP 125/63 | HR 81 | Wt 176.0 lb

## 2023-09-10 DIAGNOSIS — Z8759 Personal history of other complications of pregnancy, childbirth and the puerperium: Secondary | ICD-10-CM

## 2023-09-10 DIAGNOSIS — Z3A13 13 weeks gestation of pregnancy: Secondary | ICD-10-CM

## 2023-09-10 DIAGNOSIS — Z3491 Encounter for supervision of normal pregnancy, unspecified, first trimester: Secondary | ICD-10-CM | POA: Insufficient documentation

## 2023-09-10 DIAGNOSIS — Z3480 Encounter for supervision of other normal pregnancy, unspecified trimester: Secondary | ICD-10-CM

## 2023-09-10 DIAGNOSIS — Z3481 Encounter for supervision of other normal pregnancy, first trimester: Secondary | ICD-10-CM

## 2023-09-10 DIAGNOSIS — Z6832 Body mass index (BMI) 32.0-32.9, adult: Secondary | ICD-10-CM | POA: Insufficient documentation

## 2023-09-10 DIAGNOSIS — Z1379 Encounter for other screening for genetic and chromosomal anomalies: Secondary | ICD-10-CM

## 2023-09-10 DIAGNOSIS — Z6834 Body mass index (BMI) 34.0-34.9, adult: Secondary | ICD-10-CM | POA: Insufficient documentation

## 2023-09-10 LAB — POCT URINALYSIS DIPSTICK
Bilirubin, UA: NEGATIVE
Blood, UA: NEGATIVE
Glucose, UA: NEGATIVE
Ketones, UA: NEGATIVE
Leukocytes, UA: NEGATIVE
Nitrite, UA: NEGATIVE
Protein, UA: NEGATIVE
Spec Grav, UA: 1.02 (ref 1.010–1.025)
Urobilinogen, UA: 0.2 U/dL
pH, UA: 7.5 (ref 5.0–8.0)

## 2023-09-10 LAB — OB RESULTS CONSOLE VARICELLA ZOSTER ANTIBODY, IGG: Varicella: IMMUNE

## 2023-09-10 MED ORDER — ASPIRIN 81 MG PO CHEW: 81.0000 mg | CHEWABLE_TABLET | Freq: Every day | ORAL | Status: DC

## 2023-09-10 NOTE — Progress Notes (Signed)
New Obstetric Patient H&P    Chief Complaint: "Desires prenatal care" She has noticed an increase in vaginal discharge, about a week ago she noticed an odor, denies irritation or spotting.   History of Present Illness: Patient is a 32 y.o. [phone] Hispanic or Latino female, presents with amenorrhea and positive home pregnancy test. Patient's last menstrual period was 06/06/2023 (exact date). and based on her  LMP, her EDD is Estimated Date of Delivery: 03/12/24 and her EGA is [redacted]w[redacted]d. Cycles are  4-5. days, regular, and occur approximately every : month Her last pap smear was 2 years ago and was no abnormalities.   This was a planned pregnancy She had an [place] on 10/21 that showed an SIUP at 11wks   Since her LMP she claims she has experienced breast tenderness, cravings, upset stomach. She denies vaginal bleeding. Her past medical history is contibutory (BMI 32). Her prior pregnancies are notable for gestational HTN (SVB at term x 2, first child was 9lbs, GHTN (proteinuria noted in chart) with second pregnancy, gained "a lot of weight" in last pregnancy, denies PPD, tried to breastfeed but did not produce much milk, would like to breastfeed)  Since her LMP, she admits to the use of tobacco products  no She claims she has gained    1  pounds since the start of her pregnancy.  She admits close contact with children on a regular basis  yes  She has had chicken pox in the past unknown She has had Tuberculosis exposures, symptoms, or previously tested positive for TB   no Current or past history of domestic violence. no  Genetic Screening/Teratology Counseling: (Includes patient, baby's father, or anyone in either family with:)   1. Patient's age >/= 77 at [facility]  no 2. Thalassemia ([place], [place], Mediterranean, or Asian background): MCV<80  no 3. Neural tube defect (meningomyelocele, spina bifida, anencephaly)  no 4. Congenital heart defect  no  5. Down syndrome  no 6. Tay-Sachs (Jewish, [place])  no 7. Canavan's Disease  no 8. Sickle cell disease or trait (African)  no  9. Hemophilia or other blood disorders  no  10. Muscular dystrophy  no  11. Cystic fibrosis  no  12. Huntington's Chorea  no  13. Mental retardation/autism  no 14. Other inherited genetic or chromosomal disorder  no 15. Maternal metabolic disorder (DM, PKU, etc)  no 16. Patient or FOB with a child with a birth defect not listed above no  16a. Patient or FOB with a birth defect themselves no 17. Recurrent pregnancy loss, or stillbirth  no  18. Any medications since LMP other than prenatal vitamins (include vitamins, supplements, OTC meds, drugs, alcohol)  no 19. Any other genetic/environmental exposure to discuss  no  Infection History:   1. Lives with someone with TB or TB exposed  no  2. Patient or partner has history of genital herpes  no 3. Rash or viral illness since LMP  no 4. History of STI (GC, CT, HPV, syphilis, HIV)  no 5. History of recent travel :  recently was in [place]  Other pertinent information:  yes  Works as a Chiropodist at 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  Language  English Anatomy US    Flu Vaccine  offer Genetic Screen  NIPS:   TDaP vaccine   offer Hgb A1C or  GTT Early : Third trimester :   Covid Only one dose   LAB RESULTS   Rhogam     Blood Type   O+  06/22/18  RSV offer Antibody  Neg  06/22/18  Feeding Plan formula Rubella  1.44  11/10/17  Contraception pill RPR   Non reactive  06/22/18  Circumcision yes HBsAg     Pediatrician  [facility] HIV  Neg   04/01/18  Support Person Jaci Carrel Varicella  2721  11/10/17  Prenatal Classes no GBS  (For PCN allergy, check sensitivities)     Hep C     BTL Consent  Pap Diagnosis  Date Value Ref Range Status  02/14/2021   Final   - Negative for intraepithelial lesion or malignancy (NILM)    VBAC Consent  Hgb Electro  Neg  10/31/17  A1c 5.4  4/18/22neg  CF      SMA     GC/CHLM NEG/NEG  05/29/18       Human papilloma virus (HPV) DNA test positive 02/11/2020    Postpartum care following vaginal delivery 06/22/2018     Past Surgical History:  Past Surgical History:  Procedure Laterality Date   TYMPANOSTOMY TUBE PLACEMENT     WISDOM TOOTH EXTRACTION      Gynecologic History: Patient's last menstrual period was 06/06/2023 (exact date).  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  Tobacco Use   Smoking status: Never   Smokeless tobacco: Never  Vaping Use   Vaping status: Never Used  Substance and Sexual Activity   Alcohol use: No    Comment: former   Drug use: No   Sexual activity: Yes    Partners: Male    Birth control/protection: None  Other Topics Concern   Not on file  Social History Narrative   Not on file   Social Determinants of Health   Financial Resource Strain: Low Risk  (08/13/2023)   Overall Financial Resource Strain (CARDIA)    Difficulty of Paying Living Expenses: Not very hard  Food Insecurity: No Food Insecurity (08/13/2023)    Hunger Vital Sign    Worried About Running Out of Food in the Last Year: Never true    Ran Out of Food in the Last Year: Never true  Transportation Needs: No Transportation Needs (08/13/2023)   PRAPARE - Administrator, Civil Service (Medical): No    Lack of Transportation (Non-Medical): No  Physical Activity: Inactive (08/13/2023)   Exercise Vital Sign    Days of Exercise per Week: 0 days  Minutes of Exercise per Session: 0 min  Stress: No Stress Concern Present (08/13/2023)   [employer] of Occupational Health - Occupational Stress Questionnaire    Feeling of Stress : Not at all  Social Connections: Moderately Integrated (08/13/2023)   Social Connection and Isolation Panel [NHANES]    Frequency of Communication with Friends and Family: More than three times a week    Frequency of Social Gatherings with Friends and Family: Three times a week    Attends Religious Services: More than 4 times per year    Active Member of Clubs or Organizations: No    Attends Banker Meetings: Never    Marital Status: Married  Catering manager Violence: Not At Risk (08/13/2023)   Humiliation, Afraid, Rape, and Kick questionnaire    Fear of Current or Ex-Partner: No    Emotionally Abused: No    Physically Abused: No    Sexually Abused: No    Allergies:  No Known Allergies  Medications: Prior to Admission medications   Medication Sig Start Date End Date Taking? Authorizing Provider  Prenatal Vit-Fe Fumarate-FA (MULTIVITAMIN-PRENATAL) 27-0.8 MG TABS tablet Take 1 tablet by mouth daily at 12 noon.   Yes [provider]    Physical Exam Vitals: Blood pressure 125/63, pulse 81, weight 176 lb (79.8 kg), last menstrual period 06/06/2023.  General: NAD HEENT: normocephalic, anicteric Thyroid: no enlargement, no palpable nodules Pulmonary: No increased work of breathing, CTAB Cardiovascular: RRR, distal pulses 2+ Abdomen: NABS, soft, non-tender,  non-distended.  Umbilicus without lesions.  No hepatomegaly, splenomegaly or masses palpable. No evidence of hernia fetal heart tone 150's Genitourinary:  External: Normal external female genitalia.  Normal urethral meatus, normal  Bartholin's and Skene's glands.    Vagina: Normal vaginal mucosa, no evidence of prolapse.    Cervix: Grossly normal in appearance, no bleeding  Uterus: deferred  Adnexa:   Rectal: deferred Extremities: no edema, erythema, or tenderness Neurologic: Grossly intact Psychiatric: mood appropriate, affect full   Assessment: 32 y.o. [id number] at [redacted]w[redacted]d presenting to initiate prenatal care  Plan: 1) Avoid alcoholic beverages. 2) Patient encouraged not to smoke.  3) Discontinue the use of all non-medicinal drugs and chemicals.  4) Take prenatal vitamins daily.  5) Nutrition, food safety (fish, cheese advisories, and high nitrite foods) and exercise discussed. 6) Hospital and practice style discussed with cross coverage system.  7) Genetic Screening, such as with 1st Trimester Screening, cell free fetal DNA, AFP testing, and Ultrasound, as well as with amniocentesis and CVS as appropriate, is discussed with patient. At the conclusion of today's visit patient requested genetic testing 8) Patient is asked about travel to areas at risk for the Zika virus, and counseled to avoid travel and exposure to mosquitoes or sexual partners who may have themselves been exposed to the virus. Testing is discussed, and will be ordered as appropriate.   NOB labs collected, including PIH labs BMI >30 POC reviewed, rec daily baby ASA   Carie Caddy, CNM  [facility] 09/10/2023, 10:34 AM

## 2023-09-11 LAB — CERVICOVAGINAL ANCILLARY ONLY
Bacterial Vaginitis (gardnerella): NEGATIVE
Candida Glabrata: NEGATIVE
Candida Vaginitis: NEGATIVE
Chlamydia: NEGATIVE
Comment: NEGATIVE
Comment: NEGATIVE
Comment: NEGATIVE
Comment: NEGATIVE
Comment: NEGATIVE
Comment: NORMAL
Neisseria Gonorrhea: NEGATIVE
Trichomonas: NEGATIVE

## 2023-09-11 LAB — MICROSCOPIC EXAMINATION
Bacteria, UA: NONE SEEN
Casts: NONE SEEN /[LPF]
WBC, UA: NONE SEEN /[HPF] (ref 0–5)

## 2023-09-11 LAB — URINALYSIS, ROUTINE W REFLEX MICROSCOPIC
Bilirubin, UA: NEGATIVE
Glucose, UA: NEGATIVE
Ketones, UA: NEGATIVE
Leukocytes,UA: NEGATIVE
Nitrite, UA: NEGATIVE
RBC, UA: NEGATIVE
Specific Gravity, UA: 1.022 (ref 1.005–1.030)
Urobilinogen, Ur: 0.2 mg/dL (ref 0.2–1.0)
pH, UA: 6 (ref 5.0–7.5)

## 2023-09-11 LAB — PROTEIN / CREATININE RATIO, URINE
Creatinine, Urine: 166.6 mg/dL
Protein, Ur: 107.9 mg/dL
Protein/Creat Ratio: 648 mg/g{creat} — ABNORMAL HIGH (ref 0–200)

## 2023-09-12 ENCOUNTER — Other Ambulatory Visit: Payer: Self-pay | Admitting: Licensed Practical Nurse

## 2023-09-12 DIAGNOSIS — N049 Nephrotic syndrome with unspecified morphologic changes: Secondary | ICD-10-CM

## 2023-09-12 LAB — HEMOGLOBIN A1C
Est. average glucose Bld gHb Est-mCnc: 103 mg/dL
Hgb A1c MFr Bld: 5.2 % (ref 4.8–5.6)

## 2023-09-12 LAB — CBC/D/PLT+RPR+RH+ABO+RUBIGG...
Antibody Screen: NEGATIVE
Basophils Absolute: 0.1 10*3/uL (ref 0.0–0.2)
Basos: 1 %
EOS (ABSOLUTE): 0.1 10*3/uL (ref 0.0–0.4)
Eos: 1 %
HCV Ab: NONREACTIVE
HIV Screen 4th Generation wRfx: NONREACTIVE
Hematocrit: 39.7 % (ref 34.0–46.6)
Hemoglobin: 12.5 g/dL (ref 11.1–15.9)
Hepatitis B Surface Ag: NEGATIVE
Immature Grans (Abs): 0 10*3/uL (ref 0.0–0.1)
Immature Granulocytes: 0 %
Lymphocytes Absolute: 1.6 10*3/uL (ref 0.7–3.1)
Lymphs: 16 %
MCH: 27.5 pg (ref 26.6–33.0)
MCHC: 31.5 g/dL (ref 31.5–35.7)
MCV: 87 fL (ref 79–97)
Monocytes Absolute: 0.5 10*3/uL (ref 0.1–0.9)
Monocytes: 5 %
Neutrophils Absolute: 7.8 10*3/uL — ABNORMAL HIGH (ref 1.4–7.0)
Neutrophils: 77 %
Platelets: 337 10*3/uL (ref 150–450)
RBC: 4.54 x10E6/uL (ref 3.77–5.28)
RDW: 17.1 % — ABNORMAL HIGH (ref 11.7–15.4)
RPR Ser Ql: NONREACTIVE
Rh Factor: POSITIVE
Rubella Antibodies, IGG: 1.32 {index} (ref >0.99)
Varicella zoster IgG: REACTIVE
WBC: 10.1 10*3/uL (ref 3.4–10.8)

## 2023-09-12 LAB — COMPREHENSIVE METABOLIC PANEL
ALT: 7 [IU]/L (ref 0–32)
AST: 12 [IU]/L (ref 0–40)
Albumin: 3.8 g/dL — ABNORMAL LOW (ref 3.9–4.9)
Alkaline Phosphatase: 61 [IU]/L (ref 44–121)
BUN/Creatinine Ratio: 13 (ref 9–23)
BUN: 8 mg/dL (ref 6–20)
Bilirubin Total: 0.2 mg/dL (ref 0.0–1.2)
CO2: 18 mmol/L — ABNORMAL LOW (ref 20–29)
Calcium: 9.3 mg/dL (ref 8.7–10.2)
Chloride: 104 mmol/L (ref 96–106)
Creatinine, Ser: 0.61 mg/dL (ref 0.57–1.00)
Globulin, Total: 2.8 g/dL (ref 1.5–4.5)
Glucose: 94 mg/dL (ref 70–99)
Potassium: 4 mmol/L (ref 3.5–5.2)
Sodium: 139 mmol/L (ref 134–144)
Total Protein: 6.6 g/dL (ref 6.0–8.5)
eGFR: 122 mL/min/{1.73_m2} (ref >59)

## 2023-09-12 LAB — NICOTINE SCREEN, URINE: Cotinine Ql Scrn, Ur: NEGATIVE ng/mL

## 2023-09-12 LAB — HEPATITIS C ANTIBODY

## 2023-09-12 LAB — URINE CULTURE, OB REFLEX

## 2023-09-12 LAB — HGB FRACTIONATION CASCADE
Hgb A2: 2.4 % (ref 1.8–3.2)
Hgb A: 97.6 % (ref 96.4–98.8)
Hgb F: 0 % (ref 0.0–2.0)
Hgb S: 0 %

## 2023-09-12 LAB — MONITOR DRUG PROFILE 14(MW)
Amphetamine Scrn, Ur: NEGATIVE ng/mL
BARBITURATE SCREEN URINE: NEGATIVE ng/mL
BENZODIAZEPINE SCREEN, URINE: NEGATIVE ng/mL
Buprenorphine, Urine: NEGATIVE ng/mL
CANNABINOIDS UR QL SCN: NEGATIVE ng/mL
Cocaine (Metab) Scrn, Ur: NEGATIVE ng/mL
Creatinine(Crt), U: 165.8 mg/dL (ref 20.0–300.0)
Fentanyl, Urine: NEGATIVE pg/mL
Meperidine Screen, Urine: NEGATIVE ng/mL
Methadone Screen, Urine: NEGATIVE ng/mL
OXYCODONE+OXYMORPHONE UR QL SCN: NEGATIVE ng/mL
Opiate Scrn, Ur: NEGATIVE ng/mL
Ph of Urine: 5.6 (ref 4.5–8.9)
Phencyclidine Qn, Ur: NEGATIVE ng/mL
Propoxyphene Scrn, Ur: NEGATIVE ng/mL
SPECIFIC GRAVITY: 1.027
Tramadol Screen, Urine: NEGATIVE ng/mL

## 2023-09-12 LAB — CULTURE, OB URINE

## 2023-09-12 LAB — HCV INTERPRETATION

## 2023-09-12 NOTE — Progress Notes (Signed)
Pt seen 11/11 for NOB, Pt reported proteinuria in last pregnancy, UPC on 11/11 648. No documented visits to nephrology. Reviewed with Dr Lonny Prude. Will refer to nephrology.  TC to [place] reviewed labs and recommendation to see Nephrology. She has never seen a nephrologist. Agreeable to plan. Referral to Nephrology placed Carie Caddy, Ina Homes  [facility]  09/12/23  1:57 PM

## 2023-09-14 ENCOUNTER — Other Ambulatory Visit: Payer: Self-pay | Admitting: Nephrology

## 2023-09-14 DIAGNOSIS — R8290 Unspecified abnormal findings in urine: Secondary | ICD-10-CM

## 2023-09-14 DIAGNOSIS — R319 Hematuria, unspecified: Secondary | ICD-10-CM

## 2023-09-14 DIAGNOSIS — R809 Proteinuria, unspecified: Secondary | ICD-10-CM

## 2023-09-14 LAB — MATERNIT 21 PLUS CORE, BLOOD
Fetal Fraction: 18
Result (T21): NEGATIVE
Trisomy 13 (Patau syndrome): NEGATIVE
Trisomy 18 (Edwards syndrome): NEGATIVE
Trisomy 21 (Down syndrome): NEGATIVE

## 2023-09-20 ENCOUNTER — Ambulatory Visit
Admission: RE | Admit: 2023-09-20 | Discharge: 2023-09-20 | Disposition: A | Discharge status: Home / Self Care | Payer: No Typology Code available for payment source | Source: Ambulatory Visit | Attending: Nephrology | Admitting: Nephrology

## 2023-09-20 DIAGNOSIS — R8290 Unspecified abnormal findings in urine: Secondary | ICD-10-CM | POA: Diagnosis present

## 2023-09-20 DIAGNOSIS — R319 Hematuria, unspecified: Secondary | ICD-10-CM | POA: Insufficient documentation

## 2023-09-20 DIAGNOSIS — R809 Proteinuria, unspecified: Secondary | ICD-10-CM | POA: Diagnosis present

## 2023-10-11 ENCOUNTER — Ambulatory Visit: Payer: No Typology Code available for payment source

## 2023-10-11 VITALS — BP 113/66 | HR 98 | Wt 180.9 lb

## 2023-10-11 DIAGNOSIS — O1212 Gestational proteinuria, second trimester: Secondary | ICD-10-CM

## 2023-10-11 DIAGNOSIS — O1210 Gestational proteinuria, unspecified trimester: Secondary | ICD-10-CM | POA: Insufficient documentation

## 2023-10-11 DIAGNOSIS — Z3480 Encounter for supervision of other normal pregnancy, unspecified trimester: Secondary | ICD-10-CM

## 2023-10-11 DIAGNOSIS — Z3A18 18 weeks gestation of pregnancy: Secondary | ICD-10-CM

## 2023-10-11 NOTE — Assessment & Plan Note (Signed)
-   Reviewed need to schedule anatomy Korea. Order placed.  - Reviewed red flag warning signs anticipatory guidance for upcoming prenatal care.

## 2023-10-11 NOTE — Progress Notes (Signed)
    Return Prenatal Note   Assessment/Plan   Plan  32 y.o. [id number] at [redacted]w[redacted]d presents for follow-up OB visit. Reviewed prenatal record including previous visit note.  Proteinuria affecting pregnancy - Nephrology appointment today.  Supervision of other normal pregnancy, antepartum - Reviewed need to schedule anatomy Korea. Order placed.  - Reviewed red flag warning signs anticipatory guidance for upcoming prenatal care.    Orders Placed This Encounter  Procedures   US OB Comp + 14 Wk    Standing Status:   Future    Expiration Date:   01/09/2024    Reason for Exam (SYMPTOM  OR DIAGNOSIS REQUIRED):   Fetal Anatomic Survey    Preferred Imaging Location?:   Internal   Return in about 4 weeks (around 11/08/2023) for ROB.   Future Appointments  Date Time Provider Department Center  11/08/2023  3:35 PM Julieanne Manson, MD [facility] None    For next visit:  continue with routine prenatal care     Subjective   32 y.o. [id number] at [redacted]w[redacted]d presents for this follow-up prenatal visit.  Patient has no concerns. Patient reports: Movement: Present Contractions: Not present  Objective   Flow sheet Vitals: Pulse Rate: 98 BP: 113/66 Fundal Height: 18 cm Fetal Heart Rate (bpm): 160 Total weight gain: 3 lb 14.4 oz (1.769 kg)  General Appearance  No acute distress, well appearing, and well nourished Pulmonary   Normal work of breathing Neurologic   Alert and oriented to person, place, and time Psychiatric   Mood and affect within normal limits  Lindalou Hose Tynesia Harral, CNM  10/10/2410:23 AM

## 2023-10-11 NOTE — Assessment & Plan Note (Signed)
-   Nephrology appointment today.

## 2023-10-19 ENCOUNTER — Telehealth: Payer: Self-pay

## 2023-10-19 NOTE — Telephone Encounter (Signed)
Patient call because [facility] called to reschedule her Anatomy Scan. She wanted to make sure there was no concerns and that everything was ok. I told her everything was fine, looks like they were double booked and had to move your appointment to another day. She verbalized understanding.

## 2023-10-25 ENCOUNTER — Ambulatory Visit: Payer: No Typology Code available for payment source

## 2023-10-31 NOTE — L&D Delivery Note (Signed)
 Date of delivery: 03/16/24 Estimated Date of Delivery: 03/12/24 EGA: [redacted]w[redacted]d  Hospital Course summary: CHARLYNE ROBERTSHAW is a 33 y.o. [id number] @ [redacted]w[redacted]d who was admitted on 03/16/2024 in early labor. She was 6cm dilated and admitted. Contractions spaced out and became less intense. Despite this she slowly dilated to 8cm but when not feeling any contractions pitocin  was given to augment her labor. She then progressed to complete and began pushing with only 54mu/min of pitocin .   Delivery Note At 2:20 PM a viable female was delivered via Vaginal, Spontaneous (Presentation:   Occiput Anterior).  APGAR: 8, 9; weight pending .   Placenta status: Spontaneous, Intact.  Cord: 3 vessels with the following complications: None.    Anesthesia: None Episiotomy: None Lacerations: None Est. Blood Loss (mL): 400  Mom to postpartum.  Baby to Couplet care / Skin to Skin.  Lou Rounds 03/16/2024, 2:48 PM    Delivery Narrative  MIYOKO HASHIMI was complete at 1339 and began pushing at very shortly after. She delivered a vigorous female infant named Nickolas at [employer]. Apgars 8,9. The head followed by shoulders which were delivered without difficulty, and the rest of the body. Baby was immediately placed skin to skin on mother's chest. No Nuchal cord was noted. Delayed cord clamping with cord clamped by CNM and cut by FOB. Cord blood was obtained. Placenta was delivered at 1426, primarily by maternal efforts and with some slight cord traction. Placenta was intact, Shultz mechanism, 3 VC. Perineum inspected and found to be intact. AMSTL with IV pitocin  per unit protocol. EBL 400. Mother and baby stable.

## 2023-11-02 ENCOUNTER — Ambulatory Visit
Admission: RE | Admit: 2023-11-02 | Discharge: 2023-11-02 | Disposition: A | Discharge status: Home / Self Care | Payer: No Typology Code available for payment source | Source: Ambulatory Visit

## 2023-11-02 DIAGNOSIS — Z3689 Encounter for other specified antenatal screening: Secondary | ICD-10-CM | POA: Diagnosis not present

## 2023-11-02 DIAGNOSIS — Z3480 Encounter for supervision of other normal pregnancy, unspecified trimester: Secondary | ICD-10-CM | POA: Diagnosis present

## 2023-11-02 DIAGNOSIS — Z3A21 21 weeks gestation of pregnancy: Secondary | ICD-10-CM | POA: Diagnosis not present

## 2023-11-08 ENCOUNTER — Encounter: Payer: Self-pay | Admitting: Obstetrics

## 2023-11-08 ENCOUNTER — Ambulatory Visit (INDEPENDENT_AMBULATORY_CARE_PROVIDER_SITE_OTHER): Payer: No Typology Code available for payment source | Admitting: Obstetrics

## 2023-11-08 VITALS — BP 113/64 | HR 75 | Wt 187.0 lb

## 2023-11-08 DIAGNOSIS — Z6834 Body mass index (BMI) 34.0-34.9, adult: Secondary | ICD-10-CM

## 2023-11-08 DIAGNOSIS — Z8759 Personal history of other complications of pregnancy, childbirth and the puerperium: Secondary | ICD-10-CM | POA: Insufficient documentation

## 2023-11-08 DIAGNOSIS — Z3A22 22 weeks gestation of pregnancy: Secondary | ICD-10-CM

## 2023-11-08 DIAGNOSIS — Z3480 Encounter for supervision of other normal pregnancy, unspecified trimester: Secondary | ICD-10-CM

## 2023-11-08 DIAGNOSIS — O09892 Supervision of other high risk pregnancies, second trimester: Secondary | ICD-10-CM

## 2023-11-08 DIAGNOSIS — O1212 Gestational proteinuria, second trimester: Secondary | ICD-10-CM

## 2023-11-08 NOTE — Progress Notes (Signed)
    Return Prenatal Note   Subjective  33 y.o. [id number] at [redacted]w[redacted]d presents for this follow-up prenatal visit. Pregnancy notable for hx of gHTN in prior 2 pregnancies, proteinuria this pregnancy and BMI 34.   Patient reports some pelvic pressure yesterday. Is otherwise well. Is not remembering to take her ASA daily. Is otherwise well. Had anatomy US  at the hospital on 1/3, report not finalized, but pt reports tech telling her she will need a follow up due to incomplete views of face, spine, and possibly some other portions.   Patient reports: Movement: Present Contractions: Not present Denies vaginal bleeding or leaking fluid. Objective  Flow sheet Vitals: Pulse Rate: 75 BP: 113/64 Fundal Height: 22 cm Fetal Heart Rate (bpm): 141 Total weight gain: 10 lb (4.536 kg)  General Appearance  No acute distress, well appearing, and well nourished Pulmonary   Normal work of breathing Neurologic   Alert and oriented to person, place, and time Psychiatric   Mood and affect within normal limits  Assessment/Plan   Plan  33 y.o. [id number] at [redacted]w[redacted]d by LMP=10wk US  presents for follow-up OB visit. Reviewed prenatal record including previous visit note.  1. Supervision of other normal pregnancy, antepartum (Primary) - Follow up anatomy US  ordered for 4wks from prior  2. History of gestational hypertension -Emphasized importance of daily ASA for preeclampsia ppx  3. BMI 34 - Will need a growth US  in 3rd trimester and weekly NSTs at 36wks, 32wks if BMI dramatically increases - Limit weight gain to 20lbs total, encourage daily physical activity and healthy nutrition choices  4. Proteinuria affecting pregnancy - Pt s/p nephrology consult 10/11/23 w/[facility], they are following her with 24hr collections. Next f/u 12/06/23.   Orders Placed This Encounter  Procedures   US  OB Follow Up    Standing Status:   Future    Expected Date:   11/29/2023    Expiration Date:   12/09/2023     Reason for Exam (SYMPTOM  OR DIAGNOSIS REQUIRED):   follow up anatomy    Preferred imaging location?:   Internal             at [facility]   Return in about 4 weeks (around 12/06/2023) for rob &u/s.   Future Appointments  Date Time Provider Department Center  12/13/2023 10:15 AM [facility] US  1 AOB-IMG None  12/13/2023 10:55 AM Free, Lauraine PARAS, CNM AOB-A[facility] None   For next visit:  continue with routine prenatal care   Estil Mangle, DO [facility]

## 2023-11-08 NOTE — Patient Instructions (Signed)

## 2023-12-05 ENCOUNTER — Telehealth: Payer: Self-pay

## 2023-12-05 NOTE — Telephone Encounter (Signed)
 Patient states she started having flu symptoms this morning. Advised to contact PCP for appointment to be tested. Ok to take Tamiflu , but requires rx. Advised to treat symptoms with OTC meds. Can be seen at Urgent Care or [facility] if no appointments available at PCP. Report to ED if having difficulty breathing or unable to food/fluid down for more than 12 hours.

## 2023-12-11 ENCOUNTER — Other Ambulatory Visit: Payer: Self-pay | Admitting: Obstetrics

## 2023-12-11 DIAGNOSIS — Z362 Encounter for other antenatal screening follow-up: Secondary | ICD-10-CM

## 2023-12-13 ENCOUNTER — Ambulatory Visit: Payer: No Typology Code available for payment source

## 2023-12-13 VITALS — BP 114/65 | HR 97 | Wt 188.0 lb

## 2023-12-13 DIAGNOSIS — Z3A27 27 weeks gestation of pregnancy: Secondary | ICD-10-CM

## 2023-12-13 DIAGNOSIS — Z3A28 28 weeks gestation of pregnancy: Secondary | ICD-10-CM

## 2023-12-13 DIAGNOSIS — Z362 Encounter for other antenatal screening follow-up: Secondary | ICD-10-CM | POA: Diagnosis not present

## 2023-12-13 DIAGNOSIS — Z3480 Encounter for supervision of other normal pregnancy, unspecified trimester: Secondary | ICD-10-CM

## 2023-12-13 DIAGNOSIS — O1212 Gestational proteinuria, second trimester: Secondary | ICD-10-CM

## 2023-12-13 DIAGNOSIS — Z8759 Personal history of other complications of pregnancy, childbirth and the puerperium: Secondary | ICD-10-CM

## 2023-12-13 NOTE — Assessment & Plan Note (Signed)
-   Has nephrology appointment today. She repeated the 24 hour urine last week, numbers are stable.

## 2023-12-13 NOTE — Assessment & Plan Note (Addendum)
-   Not prepared for GDM screening today (had a Frappe and [employer] biscuit right before appointment). Will schedule lab visit next week to complete screening, reinforced diet recommendations prior to screening.  - Provided with info on Tdap vaccination, still considering.  - Repeat US today for incomplete anatomy. EFW 24%, MVP 6.5, still incomplete for spine, but per tech report, will probably not be able to complete due to advanced GA.  - Reviewed kick counts and preterm labor warning signs. Instructed to call office or come to hospital with persistent headache, vision changes, regular contractions, leaking of fluid, decreased fetal movement or vaginal bleeding.

## 2023-12-13 NOTE — Progress Notes (Signed)
    Return Prenatal Note   Assessment/Plan   Plan  33 y.o. [id number] at [redacted]w[redacted]d presents for follow-up OB visit. Reviewed prenatal record including previous visit note.  Supervision of other normal pregnancy, antepartum - Not prepared for GDM screening today (had a Frappe and [employer] biscuit right before appointment). Will schedule lab visit next week to complete screening, reinforced diet recommendations prior to screening.  - Provided with info on Tdap vaccination, still considering.  - Repeat US today for incomplete anatomy. EFW 24%, MVP 6.5, still incomplete for spine, but per tech report, will probably not be able to complete due to advanced GA.  - Reviewed kick counts and preterm labor warning signs. Instructed to call office or come to hospital with persistent headache, vision changes, regular contractions, leaking of fluid, decreased fetal movement or vaginal bleeding.  Proteinuria affecting pregnancy - Has nephrology appointment today. She repeated the 24 hour urine last week, numbers are stable.    No orders of the defined types were placed in this encounter.  Return in about 2 weeks (around 12/27/2023) for ROB.   Future Appointments  Date Time Provider Department Center  12/17/2023  9:40 AM [facility] LAB [facility] None  12/27/2023  3:15 PM Linzie Collin, MD [facility] None    For next visit:  continue with routine prenatal care     Subjective   33 y.o. [id number] at [redacted]w[redacted]d presents for this follow-up prenatal visit.  Patient had flu last week but is feeling much better now.  Patient reports: Movement: Present Contractions: Not present  Objective   Flow sheet Vitals: Pulse Rate: 97 BP: 114/65 Fundal Height: 27 cm Fetal Heart Rate (bpm): 147 by [place] Total weight gain: 11 lb (4.99 kg)  General Appearance  No acute distress, well appearing, and well nourished Pulmonary   Normal work of breathing Neurologic   Alert and oriented to person, place, and  time Psychiatric   Mood and affect within normal limits  Lindalou Hose Mclain Freer, CNM  12/12/2509:26 AM

## 2023-12-14 NOTE — Addendum Note (Signed)
Addended by: Burtis Junes on: 12/14/2023 02:34 PM   Modules accepted: Orders

## 2023-12-17 ENCOUNTER — Other Ambulatory Visit: Payer: Self-pay

## 2023-12-17 ENCOUNTER — Other Ambulatory Visit: Payer: No Typology Code available for payment source

## 2023-12-17 DIAGNOSIS — Z3A28 28 weeks gestation of pregnancy: Secondary | ICD-10-CM

## 2023-12-18 LAB — 28 WEEK RH+PANEL
Basophils Absolute: 0 10*3/uL (ref 0.0–0.2)
Basos: 0 %
EOS (ABSOLUTE): 0.1 10*3/uL (ref 0.0–0.4)
Eos: 1 %
Gestational Diabetes Screen: 141 mg/dL — ABNORMAL HIGH (ref 70–139)
HIV Screen 4th Generation wRfx: NONREACTIVE
Hematocrit: 37.3 % (ref 34.0–46.6)
Hemoglobin: 12.1 g/dL (ref 11.1–15.9)
Immature Grans (Abs): 0.1 10*3/uL (ref 0.0–0.1)
Immature Granulocytes: 1 %
Lymphocytes Absolute: 1.9 10*3/uL (ref 0.7–3.1)
Lymphs: 16 %
MCH: 29.4 pg (ref 26.6–33.0)
MCHC: 32.4 g/dL (ref 31.5–35.7)
MCV: 91 fL (ref 79–97)
Monocytes Absolute: 0.5 10*3/uL (ref 0.1–0.9)
Monocytes: 4 %
Neutrophils Absolute: 9.2 10*3/uL — ABNORMAL HIGH (ref 1.4–7.0)
Neutrophils: 78 %
Platelets: 294 10*3/uL (ref 150–450)
RBC: 4.12 x10E6/uL (ref 3.77–5.28)
RDW: 13.3 % (ref 11.7–15.4)
RPR Ser Ql: NONREACTIVE
WBC: 11.8 10*3/uL — ABNORMAL HIGH (ref 3.4–10.8)

## 2023-12-20 ENCOUNTER — Other Ambulatory Visit: Payer: Self-pay

## 2023-12-20 DIAGNOSIS — R7309 Other abnormal glucose: Secondary | ICD-10-CM

## 2023-12-21 ENCOUNTER — Other Ambulatory Visit: Payer: Self-pay

## 2023-12-21 DIAGNOSIS — R7309 Other abnormal glucose: Secondary | ICD-10-CM

## 2023-12-27 ENCOUNTER — Encounter: Payer: No Typology Code available for payment source | Admitting: Obstetrics and Gynecology

## 2023-12-27 ENCOUNTER — Other Ambulatory Visit: Payer: No Typology Code available for payment source

## 2023-12-27 ENCOUNTER — Encounter: Payer: No Typology Code available for payment source | Admitting: Obstetrics

## 2023-12-28 ENCOUNTER — Ambulatory Visit: Payer: No Typology Code available for payment source | Admitting: Certified Nurse Midwife

## 2023-12-28 ENCOUNTER — Other Ambulatory Visit: Payer: No Typology Code available for payment source

## 2023-12-28 VITALS — BP 105/76 | HR 77 | Wt 191.3 lb

## 2023-12-28 DIAGNOSIS — O1210 Gestational proteinuria, unspecified trimester: Secondary | ICD-10-CM

## 2023-12-28 DIAGNOSIS — R7309 Other abnormal glucose: Secondary | ICD-10-CM

## 2023-12-28 DIAGNOSIS — Z23 Encounter for immunization: Secondary | ICD-10-CM | POA: Diagnosis not present

## 2023-12-28 DIAGNOSIS — O1213 Gestational proteinuria, third trimester: Secondary | ICD-10-CM | POA: Diagnosis not present

## 2023-12-28 DIAGNOSIS — Z3A29 29 weeks gestation of pregnancy: Secondary | ICD-10-CM

## 2023-12-28 DIAGNOSIS — Z3480 Encounter for supervision of other normal pregnancy, unspecified trimester: Secondary | ICD-10-CM

## 2023-12-28 NOTE — Assessment & Plan Note (Signed)
 Continue monthly 24h urine for total protein with nephrology, next 3/13.

## 2023-12-28 NOTE — Progress Notes (Signed)
    Return Prenatal Note   Subjective   33 y.o. [id number] at [redacted]w[redacted]d presents for this follow-up prenatal visit.  Patient feeling well, completing 3h gtt today. TDAP today. Following with nephrology, next visit 3/13. Sex of baby is a surprise, plan for her husband to announce at time of birth. Patient reports: Movement: Present Contractions: Not present  Objective   Flow sheet Vitals: Pulse Rate: 77 BP: 105/76 Fundal Height: 30 cm Fetal Heart Rate (bpm): 150 Total weight gain: 14 lb 4.8 oz (6.486 kg)  General Appearance  No acute distress, well appearing, and well nourished Pulmonary   Normal work of breathing Neurologic   Alert and oriented to person, place, and time Psychiatric   Mood and affect within normal limits  Assessment/Plan   Plan  33 y.o. [id number] at [redacted]w[redacted]d presents for follow-up OB visit. Reviewed prenatal record including previous visit note.  Proteinuria affecting pregnancy Continue monthly 24h urine for total protein with nephrology, next 3/13.  Supervision of other normal pregnancy, antepartum Reviewed kick counts and preterm labor warning signs. Instructed to call office or come to hospital with persistent headache, vision changes, regular contractions, leaking of fluid, decreased fetal movement or vaginal bleeding. Will notify via MyChart of 3h gtt results.      Orders Placed This Encounter  Procedures   Tdap vaccine greater than or equal to 7yo IM   Return in 2 weeks (on 01/11/2024) for ROB.   No future appointments.   For next visit:  continue with routine prenatal care     Dominica Severin, CNM  12/27/2509:19 AM

## 2023-12-28 NOTE — Assessment & Plan Note (Signed)
 Reviewed kick counts and preterm labor warning signs. Instructed to call office or come to hospital with persistent headache, vision changes, regular contractions, leaking of fluid, decreased fetal movement or vaginal bleeding. Will notify via MyChart of 3h gtt results.

## 2023-12-29 LAB — GESTATIONAL GLUCOSE TOLERANCE
Glucose, Fasting: 80 mg/dL (ref 70–94)
Glucose, GTT - 1 Hour: 164 mg/dL (ref 70–179)
Glucose, GTT - 2 Hour: 134 mg/dL (ref 70–154)
Glucose, GTT - 3 Hour: 96 mg/dL (ref 70–139)

## 2024-01-11 ENCOUNTER — Ambulatory Visit: Payer: No Typology Code available for payment source

## 2024-01-11 VITALS — BP 113/72 | HR 92 | Wt 194.0 lb

## 2024-01-11 DIAGNOSIS — Z8759 Personal history of other complications of pregnancy, childbirth and the puerperium: Secondary | ICD-10-CM

## 2024-01-11 DIAGNOSIS — O1210 Gestational proteinuria, unspecified trimester: Secondary | ICD-10-CM

## 2024-01-11 DIAGNOSIS — Z3A31 31 weeks gestation of pregnancy: Secondary | ICD-10-CM

## 2024-01-11 DIAGNOSIS — Z6834 Body mass index (BMI) 34.0-34.9, adult: Secondary | ICD-10-CM

## 2024-01-11 DIAGNOSIS — O1213 Gestational proteinuria, third trimester: Secondary | ICD-10-CM

## 2024-01-11 DIAGNOSIS — Z3480 Encounter for supervision of other normal pregnancy, unspecified trimester: Secondary | ICD-10-CM

## 2024-01-11 NOTE — Assessment & Plan Note (Signed)
-   Nephrology appointment on 3/13. Per patient, her protein numbers are up a little bit but doctor is not concerned at this time. Has follow up appointment in 8 weeks if she doesn't deliver before that time.

## 2024-01-11 NOTE — Assessment & Plan Note (Addendum)
-   We reviewed her anatomy ultrasound incomplete for views of spine and that given her advanced gestational age, it will probably not be possible to get all the views needed. No additional ultrasounds ordered at this time.   - Discussed birth plan. Desires to go unmedicated which she has done with both of her previous two births.  - Reviewed kick counts and preterm labor warning signs. Instructed to call office or come to hospital with persistent headache, vision changes, regular contractions, leaking of fluid, decreased fetal movement or vaginal bleeding.

## 2024-01-11 NOTE — Progress Notes (Signed)
    Return Prenatal Note   Assessment/Plan   Plan  [name] at [redacted]w[redacted]d presents for follow-up OB visit. Reviewed prenatal record including previous visit note.  Supervision of other normal pregnancy, antepartum - We reviewed her anatomy ultrasound incomplete for views of spine and that given her advanced gestational age, it will probably not be possible to get all the views needed. No additional ultrasounds ordered at this time.   - Discussed birth plan. Desires to go unmedicated which she has done with both of her previous two births.  - Reviewed kick counts and preterm labor warning signs. Instructed to call office or come to hospital with persistent headache, vision changes, regular contractions, leaking of fluid, decreased fetal movement or vaginal bleeding.   Proteinuria affecting pregnancy - Nephrology appointment on 3/13. Per patient, her protein numbers are up a little bit but doctor is not concerned at this time. Has follow up appointment in 8 weeks if she doesn't deliver before that time.   No orders of the defined types were placed in this encounter.  Return in about 2 weeks (around 01/25/2024) for ROB.   Future Appointments  Date Time Provider Department Center  01/25/2024  2:35 PM Dominic, Courtney Heys, CNM [facility] None    For next visit:  continue with routine prenatal care     Subjective   33 y.o. [id number] at [redacted]w[redacted]d presents for this follow-up prenatal visit.  Patient has no concerns today, feeling well.  Patient reports: Movement: Present Contractions: Irritability  Objective   Flow sheet Vitals: Pulse Rate: 92 BP: 113/72 Fundal Height: 31 cm Fetal Heart Rate (bpm): 145 Total weight gain: 17 lb (7.711 kg)  General Appearance  No acute distress, well appearing, and well nourished Pulmonary   Normal work of breathing Neurologic   Alert and oriented to person, place, and time Psychiatric   Mood and affect within normal limits  Carolyn Wheeler, CNM   03/14/258:54 AM

## 2024-01-16 ENCOUNTER — Other Ambulatory Visit (HOSPITAL_BASED_OUTPATIENT_CLINIC_OR_DEPARTMENT_OTHER): Payer: Self-pay | Admitting: Nephrology

## 2024-01-16 DIAGNOSIS — R809 Proteinuria, unspecified: Secondary | ICD-10-CM

## 2024-01-16 DIAGNOSIS — R109 Unspecified abdominal pain: Secondary | ICD-10-CM

## 2024-01-16 DIAGNOSIS — R8290 Unspecified abnormal findings in urine: Secondary | ICD-10-CM

## 2024-01-16 DIAGNOSIS — R319 Hematuria, unspecified: Secondary | ICD-10-CM

## 2024-01-17 ENCOUNTER — Ambulatory Visit (INDEPENDENT_AMBULATORY_CARE_PROVIDER_SITE_OTHER)
Admission: RE | Admit: 2024-01-17 | Discharge: 2024-01-17 | Disposition: A | Discharge status: Home / Self Care | Source: Ambulatory Visit | Attending: Nephrology | Admitting: Nephrology

## 2024-01-17 DIAGNOSIS — R319 Hematuria, unspecified: Secondary | ICD-10-CM

## 2024-01-17 DIAGNOSIS — O1213 Gestational proteinuria, third trimester: Secondary | ICD-10-CM

## 2024-01-17 DIAGNOSIS — R109 Unspecified abdominal pain: Secondary | ICD-10-CM

## 2024-01-17 DIAGNOSIS — R809 Proteinuria, unspecified: Secondary | ICD-10-CM

## 2024-01-17 DIAGNOSIS — Z3A32 32 weeks gestation of pregnancy: Secondary | ICD-10-CM | POA: Diagnosis not present

## 2024-01-17 DIAGNOSIS — R8290 Unspecified abnormal findings in urine: Secondary | ICD-10-CM

## 2024-01-17 DIAGNOSIS — O288 Other abnormal findings on antenatal screening of mother: Secondary | ICD-10-CM | POA: Diagnosis not present

## 2024-01-23 ENCOUNTER — Ambulatory Visit (INDEPENDENT_AMBULATORY_CARE_PROVIDER_SITE_OTHER): Admitting: Certified Nurse Midwife

## 2024-01-23 VITALS — BP 115/75 | HR 81 | Wt 198.6 lb

## 2024-01-23 DIAGNOSIS — Z3480 Encounter for supervision of other normal pregnancy, unspecified trimester: Secondary | ICD-10-CM | POA: Diagnosis not present

## 2024-01-23 NOTE — Patient Instructions (Signed)
 Third Trimester of Pregnancy  The third trimester of pregnancy is from week 28 through week 40. This is months 7 through 9. The third trimester is a time when your baby is growing fast. Body changes during your third trimester Your body continues to change during this time. The changes usually go away after your baby is born. Physical changes You will continue to gain weight. You may get stretch marks on your hips, belly, and breasts. Your breasts will keep growing and may hurt. A yellow fluid (colostrum) may leak from your breasts. This is the first milk you're making for your baby. Your hair may grow faster and get thicker. In some cases, you may get hair loss. Your belly button may stick out. You may have more swelling in your hands, face, or ankles. Health changes You may have heartburn. You may feel short of breath. This is caused by the uterus that is now bigger. You may have more aches in the pelvis, back, or thighs. You may have more tingling or numbness in your hands, arms, and legs. You may pee more often. You may have trouble pooping (constipation) or swollen veins in the butt that can itch or get painful (hemorrhoids). Other changes You may have more problems sleeping. You may notice the baby moving lower in your belly (dropping). You may have more fluid coming from your vagina. Your joints may feel loose, and you may have pain around your pelvic bone. Follow these instructions at home: Medicines Take medicines only as told by your health care provider. Some medicines are not safe during pregnancy. Your provider may change the medicines that you take. Do not take any medicines unless told to by your provider. Take a prenatal vitamin that has at least 600 micrograms (mcg) of folic acid. Do not use herbal medicines, illegal drugs, or medicines that are not approved by your provider. Eating and drinking While you're pregnant your body needs additional nutrition to help  support your growing baby. Talk with your provider about your nutritional needs. Activity Most women are able to exercise regularly during pregnancy. Exercise routines may need to change at the end of your pregnancy. Talk to your provider about your activities and exercise routine. Relieving pain and discomfort Rest often with your legs raised if you have leg cramps or low back pain. Take warm sitz baths to soothe pain from hemorrhoids. Use hemorrhoid cream if your provider says it's okay. Wear a good, supportive bra if your breasts hurt. Do not use hot tubs, steam rooms, or saunas. Do not douche. Do not use tampons or scented pads. Safety Talk to your provider before traveling far distances. Wear your seatbelt at all times when you're in a car. Talk to your provider if someone hits you, hurts you, or yells at you. Preparing for birth To prepare for your baby: Take childbirth and breastfeeding classes. Visit the hospital and tour the maternity area. Buy a rear-facing car seat. Learn how to install it in your car. General instructions Avoid cat litter boxes and soil used by cats. These things carry germs that can cause harm to your pregnancy and your baby. Do not drink alcohol, smoke, vape, or use products with nicotine or tobacco in them. If you need help quitting, talk with your provider. Keep all follow-up visits for your third trimester. Your provider will do more exams and tests during this trimester. Write down your questions. Take them to your prenatal visits. Your provider also will: Talk with you about  your overall health. Give you advice or refer you to specialists who can help with different needs, including: Mental health and counseling. Foods and healthy eating. Ask for help if you need help with food. Where to find more information American Pregnancy Association: americanpregnancy.org [employer]: acog.org Office on [employer] Health:  [employer] Contact a health care provider if: You have a headache that does not go away when you take medicine. You have any of these problems: You can't eat or drink. You have nausea and vomiting. You have watery poop (diarrhea) for 2 days or more. You have pain when you pee, or your pee smells bad. You have been sick for 2 days or more and aren't getting better. Contact your provider right away if: You have any of these coming from your vagina: Abnormal discharge. Bad-smelling fluid. Bleeding. Your baby is moving less than usual. You have signs of labor: You have any contractions, belly cramping, or have pain in your pelvis or lower back before 37 weeks of pregnancy (preterm labor). You have regular contractions that are less than 5 minutes apart. Your water breaks. You have symptoms of high blood pressure or preeclampsia. These include: A severe, throbbing headache that does not go away. Sudden or extreme swelling of your face, hands, legs, or feet. Vision problems: You see spots. You have blurry vision. Your eyes are sensitive to light. If you can't reach your provider, go to an urgent care or emergency room. Get help right away if: You faint, become confused, or can't think clearly. You have chest pain or trouble breathing. You have any kind of injury, such as from a fall or a car crash. These symptoms may be an emergency. Call 911 right away. Do not wait to see if the symptoms will go away. Do not drive yourself to the hospital. This information is not intended to replace advice given to you by your health care provider. Make sure you discuss any questions you have with your health care provider. Document Revised: 07/19/2023 Document Reviewed: 02/16/2023 Elsevier Patient Education  2024 [employer].

## 2024-01-23 NOTE — Assessment & Plan Note (Addendum)
 Reviewed kick counts and preterm labor warning signs. Instructed to call office or come to hospital with persistent headache, vision changes, regular contractions, leaking of fluid, decreased fetal movement or vaginal bleeding. Ok to start pepcid 10mg  po daily at bedtime to help with heartburn, may increase to maximum of 20mg  twice a day if needed.

## 2024-01-23 NOTE — Progress Notes (Signed)
    Return Prenatal Note   Subjective   33 y.o. [id number] at [redacted]w[redacted]d presents for this follow-up prenatal visit.  Patient feeling well today, had significant left flank/back pain last week & was seen by nephrologist, ultrasound did not find any problems, pain has now resolved. Also with heartburn Patient reports: Movement: Present Contractions: Not present  Objective   Flow sheet Vitals: Pulse Rate: 81 BP: 115/75 Fundal Height: 33 cm Fetal Heart Rate (bpm): 130 Presentation: Vertex Total weight gain: 21 lb 9.6 oz (9.798 kg)  General Appearance  No acute distress, well appearing, and well nourished Pulmonary   Normal work of breathing Neurologic   Alert and oriented to person, place, and time Psychiatric   Mood and affect within normal limits  Assessment/Plan   Plan  33 y.o. [id number] at [redacted]w[redacted]d presents for follow-up OB visit. Reviewed prenatal record including previous visit note.  Supervision of other normal pregnancy, antepartum Reviewed kick counts and preterm labor warning signs. Instructed to call office or come to hospital with persistent headache, vision changes, regular contractions, leaking of fluid, decreased fetal movement or vaginal bleeding. Ok to start pepcid 10mg  po daily at bedtime to help with heartburn, may increase to maximum of 20mg  twice a day if needed.      No orders of the defined types were placed in this encounter.  Return in 2 weeks (on 02/06/2024) for ROB.   Future Appointments  Date Time Provider Department Center  02/07/2024  3:15 PM Carolyn Wheeler, Carolyn Wheeler [facility] None    For next visit:  continue with routine prenatal care     Carolyn Wheeler, Carolyn Wheeler  03/26/255:02 PM

## 2024-01-25 ENCOUNTER — Encounter: Admitting: Licensed Practical Nurse

## 2024-02-07 ENCOUNTER — Ambulatory Visit (INDEPENDENT_AMBULATORY_CARE_PROVIDER_SITE_OTHER): Admitting: Obstetrics

## 2024-02-07 VITALS — BP 117/74 | HR 87 | Wt 198.0 lb

## 2024-02-07 DIAGNOSIS — Z3A35 35 weeks gestation of pregnancy: Secondary | ICD-10-CM | POA: Diagnosis not present

## 2024-02-07 DIAGNOSIS — O1210 Gestational proteinuria, unspecified trimester: Secondary | ICD-10-CM

## 2024-02-07 DIAGNOSIS — Z6834 Body mass index (BMI) 34.0-34.9, adult: Secondary | ICD-10-CM

## 2024-02-07 DIAGNOSIS — Z8759 Personal history of other complications of pregnancy, childbirth and the puerperium: Secondary | ICD-10-CM

## 2024-02-07 DIAGNOSIS — Z3483 Encounter for supervision of other normal pregnancy, third trimester: Secondary | ICD-10-CM

## 2024-02-07 DIAGNOSIS — Z3480 Encounter for supervision of other normal pregnancy, unspecified trimester: Secondary | ICD-10-CM

## 2024-02-07 NOTE — Assessment & Plan Note (Signed)
-   Reviewed kick counts and preterm labor warning signs. Instructed to call office or come to hospital with persistent headache, vision changes, regular contractions, leaking of fluid, decreased fetal movement or vaginal bleeding.   - May use belly band or sheet/shaw to help support and minimize pain.  - reviewed collecting GBS swab at next visit.  - Anticipatory guidance reviewed.

## 2024-02-07 NOTE — Progress Notes (Signed)
    Return Prenatal Note   Assessment/Plan   Plan  33 y.o. [id number] at [redacted]w[redacted]d presents for follow-up OB visit. Reviewed prenatal record including previous visit note.  Supervision of other normal pregnancy, antepartum - Reviewed kick counts and preterm labor warning signs. Instructed to call office or come to hospital with persistent headache, vision changes, regular contractions, leaking of fluid, decreased fetal movement or vaginal bleeding.   - May use belly band or sheet/shaw to help support and minimize pain.  - reviewed collecting GBS swab at next visit.  - Anticipatory guidance reviewed.     No orders of the defined types were placed in this encounter.  Return in about 1 week (around 02/14/2024).   No future appointments.  For next visit:  Routine prenatal care    Subjective  Elouise is feeling well overall, she has had some round ligament pain over the past week. Denies LOF, VB.   Movement: Present Contractions: Irritability  Objective   Flow sheet Vitals: Pulse Rate: 87 BP: 117/74 Fundal Height: 34 cm Fetal Heart Rate (bpm): 140 Total weight gain: 9.526 kg  General Appearance  No acute distress, well appearing, and well nourished Pulmonary   Normal work of breathing Neurologic   Alert and oriented to person, place, and time Psychiatric   Mood and affect within normal limits  Ulice Dash, [facility] 02/07/24 3:48 PM

## 2024-02-13 ENCOUNTER — Ambulatory Visit (INDEPENDENT_AMBULATORY_CARE_PROVIDER_SITE_OTHER): Admitting: Licensed Practical Nurse

## 2024-02-13 ENCOUNTER — Other Ambulatory Visit (HOSPITAL_COMMUNITY)
Admission: RE | Admit: 2024-02-13 | Discharge: 2024-02-13 | Disposition: A | Discharge status: Home / Self Care | Source: Ambulatory Visit | Attending: Licensed Practical Nurse | Admitting: Licensed Practical Nurse

## 2024-02-13 VITALS — BP 115/74 | HR 80 | Wt 203.2 lb

## 2024-02-13 DIAGNOSIS — Z3A36 36 weeks gestation of pregnancy: Secondary | ICD-10-CM | POA: Insufficient documentation

## 2024-02-13 DIAGNOSIS — Z3480 Encounter for supervision of other normal pregnancy, unspecified trimester: Secondary | ICD-10-CM | POA: Insufficient documentation

## 2024-02-13 DIAGNOSIS — Z3685 Encounter for antenatal screening for Streptococcus B: Secondary | ICD-10-CM

## 2024-02-13 DIAGNOSIS — O321XX0 Maternal care for breech presentation, not applicable or unspecified: Secondary | ICD-10-CM | POA: Diagnosis not present

## 2024-02-13 DIAGNOSIS — O1213 Gestational proteinuria, third trimester: Secondary | ICD-10-CM | POA: Diagnosis not present

## 2024-02-13 NOTE — Assessment & Plan Note (Signed)
-  has family to watch children while in labor and for PP support.  -36wk labs collected

## 2024-02-13 NOTE — Assessment & Plan Note (Signed)
-  BSUS confirms breech -Breech H.O given, ECV scheduled 4/29 at noon

## 2024-02-13 NOTE — Progress Notes (Signed)
    Return Prenatal Note   Subjective   33 y.o. [id number] at [redacted]w[redacted]d presents for this follow-up prenatal visit.  Patient Doing ok, wakes frequently to urinate. Mood has been good. Breech today, desires ECV if needed  Patient reports: Movement: Present Contractions: Irritability  Objective   Flow sheet Vitals: Pulse Rate: 80 BP: 115/74 Fundal Height: 37 cm Fetal Heart Rate (bpm): 130 Presentation: Complete Breech Dilation: Closed Effacement (%): Thick Station: Ballotable Total weight gain: 26 lb 3.2 oz (11.9 kg)  General Appearance  No acute distress, well appearing, and well nourished Pulmonary   Normal work of breathing Neurologic   Alert and oriented to person, place, and time Psychiatric   Mood and affect within normal limits  Assessment/Plan   Plan  33 y.o. [id number] at [redacted]w[redacted]d presents for follow-up OB visit. Reviewed prenatal record including previous visit note.  Breech presentation -BSUS confirms breech -Breech H.O given, ECV scheduled 4/29 at noon   Proteinuria affecting pregnancy in third trimester -has family to watch children while in labor and for PP support.  -36wk labs collected      Orders Placed This Encounter  Procedures   Strep Gp B NAA   No follow-ups on file.   Future Appointments  Date Time Provider Department Center  02/21/2024  8:15 AM Forestine Igo, CNM [facility] None  02/29/2024 10:35 AM Free, Verita Glassman, CNM [facility] None  03/06/2024  8:55 AM Slaughterbeck, Sherline Distel, CNM [facility] None    For next visit:  continue with routine prenatal care     Berkley Breech [facility], CNM  02/13/2511:27 PM

## 2024-02-14 LAB — CERVICOVAGINAL ANCILLARY ONLY
Chlamydia: NEGATIVE
Comment: NEGATIVE
Comment: NORMAL
Neisseria Gonorrhea: NEGATIVE

## 2024-02-15 LAB — STREP GP B NAA: Strep Gp B NAA: NEGATIVE

## 2024-02-21 ENCOUNTER — Ambulatory Visit (INDEPENDENT_AMBULATORY_CARE_PROVIDER_SITE_OTHER): Admitting: Certified Nurse Midwife

## 2024-02-21 VITALS — BP 112/87 | HR 80 | Wt 202.1 lb

## 2024-02-21 DIAGNOSIS — Z3A37 37 weeks gestation of pregnancy: Secondary | ICD-10-CM | POA: Diagnosis not present

## 2024-02-21 DIAGNOSIS — O320XX0 Maternal care for unstable lie, not applicable or unspecified: Secondary | ICD-10-CM | POA: Diagnosis not present

## 2024-02-21 DIAGNOSIS — O321XX0 Maternal care for breech presentation, not applicable or unspecified: Secondary | ICD-10-CM | POA: Diagnosis not present

## 2024-02-21 DIAGNOSIS — Z3480 Encounter for supervision of other normal pregnancy, unspecified trimester: Secondary | ICD-10-CM

## 2024-02-21 NOTE — Progress Notes (Signed)
    Return Prenatal Note   Subjective   33 y.o. [id number] at [redacted]w[redacted]d presents for this follow-up prenatal visit.  Patient feeling well, has been doing breech exercises to encourage vertex position. Has researched ECV & feels she would prefer to avoid ECV & schedule Cesarean if baby were to remain breech. Glad baby has flipped to vertex! Reviewed that chance exists for baby to return to breech position & that we will check for position at admit for labor. Patient reports: Movement: Present Contractions: Irritability  Objective   Flow sheet Vitals: Pulse Rate: 80 BP: 112/87 Fundal Height: 37 cm Fetal Heart Rate (bpm): 140 Presentation: Vertex (BSUS) Total weight gain: 25 lb 1.6 oz (11.4 kg)  General Appearance  No acute distress, well appearing, and well nourished Pulmonary   Normal work of breathing Neurologic   Alert and oriented to person, place, and time Psychiatric   Mood and affect within normal limits  Assessment/Plan   Plan  33 y.o. [id number] at [redacted]w[redacted]d presents for follow-up OB visit. Reviewed prenatal record including previous visit note.  Supervision of other normal pregnancy, antepartum Reviewed kick counts and preterm labor warning signs. Instructed to call office or come to hospital with persistent headache, vision changes, regular contractions, leaking of fluid, decreased fetal movement or vaginal bleeding.   Unstable fetal lie Vertex on BSUS today, stop breech exercises, consider activity to encourage descent. Check presentation at admit for labor.      No orders of the defined types were placed in this encounter.  Return in 1 week (on 02/28/2024) for ROB.   Future Appointments  Date Time Provider Department Center  02/29/2024 10:35 AM Free, Verita Glassman, CNM [facility] None  03/06/2024  8:55 AM Slaughterbeck, Sherline Distel, CNM [facility] None    For next visit:  continue with routine prenatal care     Forestine Igo, CNM  02/20/2509:42 AM

## 2024-02-21 NOTE — Assessment & Plan Note (Signed)
 Reviewed kick counts and preterm labor warning signs. Instructed to call office or come to hospital with persistent headache, vision changes, regular contractions, leaking of fluid, decreased fetal movement or vaginal bleeding.

## 2024-02-21 NOTE — Patient Instructions (Signed)
 Third Trimester of Pregnancy  The third trimester of pregnancy is from week 28 through week 40. This is months 7 through 9. The third trimester is a time when your baby is growing fast. Body changes during your third trimester Your body continues to change during this time. The changes usually go away after your baby is born. Physical changes You will continue to gain weight. You may get stretch marks on your hips, belly, and breasts. Your breasts will keep growing and may hurt. A yellow fluid (colostrum) may leak from your breasts. This is the first milk you're making for your baby. Your hair may grow faster and get thicker. In some cases, you may get hair loss. Your belly button may stick out. You may have more swelling in your hands, face, or ankles. Health changes You may have heartburn. You may feel short of breath. This is caused by the uterus that is now bigger. You may have more aches in the pelvis, back, or thighs. You may have more tingling or numbness in your hands, arms, and legs. You may pee more often. You may have trouble pooping (constipation) or swollen veins in the butt that can itch or get painful (hemorrhoids). Other changes You may have more problems sleeping. You may notice the baby moving lower in your belly (dropping). You may have more fluid coming from your vagina. Your joints may feel loose, and you may have pain around your pelvic bone. Follow these instructions at home: Medicines Take medicines only as told by your health care provider. Some medicines are not safe during pregnancy. Your provider may change the medicines that you take. Do not take any medicines unless told to by your provider. Take a prenatal vitamin that has at least 600 micrograms (mcg) of folic acid. Do not use herbal medicines, illegal drugs, or medicines that are not approved by your provider. Eating and drinking While you're pregnant your body needs additional nutrition to help  support your growing baby. Talk with your provider about your nutritional needs. Activity Most women are able to exercise regularly during pregnancy. Exercise routines may need to change at the end of your pregnancy. Talk to your provider about your activities and exercise routine. Relieving pain and discomfort Rest often with your legs raised if you have leg cramps or low back pain. Take warm sitz baths to soothe pain from hemorrhoids. Use hemorrhoid cream if your provider says it's okay. Wear a good, supportive bra if your breasts hurt. Do not use hot tubs, steam rooms, or saunas. Do not douche. Do not use tampons or scented pads. Safety Talk to your provider before traveling far distances. Wear your seatbelt at all times when you're in a car. Talk to your provider if someone hits you, hurts you, or yells at you. Preparing for birth To prepare for your baby: Take childbirth and breastfeeding classes. Visit the hospital and tour the maternity area. Buy a rear-facing car seat. Learn how to install it in your car. General instructions Avoid cat litter boxes and soil used by cats. These things carry germs that can cause harm to your pregnancy and your baby. Do not drink alcohol, smoke, vape, or use products with nicotine or tobacco in them. If you need help quitting, talk with your provider. Keep all follow-up visits for your third trimester. Your provider will do more exams and tests during this trimester. Write down your questions. Take them to your prenatal visits. Your provider also will: Talk with you about  your overall health. Give you advice or refer you to specialists who can help with different needs, including: Mental health and counseling. Foods and healthy eating. Ask for help if you need help with food. Where to find more information American Pregnancy Association: americanpregnancy.org [employer]: acog.org Office on [employer] Health:  [employer] Contact a health care provider if: You have a headache that does not go away when you take medicine. You have any of these problems: You can't eat or drink. You have nausea and vomiting. You have watery poop (diarrhea) for 2 days or more. You have pain when you pee, or your pee smells bad. You have been sick for 2 days or more and aren't getting better. Contact your provider right away if: You have any of these coming from your vagina: Abnormal discharge. Bad-smelling fluid. Bleeding. Your baby is moving less than usual. You have signs of labor: You have any contractions, belly cramping, or have pain in your pelvis or lower back before 37 weeks of pregnancy (preterm labor). You have regular contractions that are less than 5 minutes apart. Your water breaks. You have symptoms of high blood pressure or preeclampsia. These include: A severe, throbbing headache that does not go away. Sudden or extreme swelling of your face, hands, legs, or feet. Vision problems: You see spots. You have blurry vision. Your eyes are sensitive to light. If you can't reach your provider, go to an urgent care or emergency room. Get help right away if: You faint, become confused, or can't think clearly. You have chest pain or trouble breathing. You have any kind of injury, such as from a fall or a car crash. These symptoms may be an emergency. Call 911 right away. Do not wait to see if the symptoms will go away. Do not drive yourself to the hospital. This information is not intended to replace advice given to you by your health care provider. Make sure you discuss any questions you have with your health care provider. Document Revised: 07/19/2023 Document Reviewed: 02/16/2023 Elsevier Patient Education  2024 [employer].

## 2024-02-21 NOTE — Assessment & Plan Note (Signed)
 Vertex on BSUS today, stop breech exercises, consider activity to encourage descent. Check presentation at admit for labor.

## 2024-02-25 ENCOUNTER — Other Ambulatory Visit: Payer: Self-pay

## 2024-02-25 ENCOUNTER — Encounter: Payer: Self-pay | Admitting: Obstetrics & Gynecology

## 2024-02-25 ENCOUNTER — Observation Stay

## 2024-02-25 ENCOUNTER — Telehealth: Payer: Self-pay

## 2024-02-25 ENCOUNTER — Observation Stay
Admission: EM | Admit: 2024-02-25 | Discharge: 2024-02-25 | Disposition: A | Discharge status: Home / Self Care | Attending: Advanced Practice Midwife | Admitting: Advanced Practice Midwife

## 2024-02-25 DIAGNOSIS — O26893 Other specified pregnancy related conditions, third trimester: Principal | ICD-10-CM | POA: Insufficient documentation

## 2024-02-25 DIAGNOSIS — M549 Dorsalgia, unspecified: Secondary | ICD-10-CM

## 2024-02-25 DIAGNOSIS — Z3A37 37 weeks gestation of pregnancy: Secondary | ICD-10-CM | POA: Diagnosis not present

## 2024-02-25 DIAGNOSIS — O99891 Other specified diseases and conditions complicating pregnancy: Secondary | ICD-10-CM

## 2024-02-25 DIAGNOSIS — Z6834 Body mass index (BMI) 34.0-34.9, adult: Secondary | ICD-10-CM

## 2024-02-25 DIAGNOSIS — Z3480 Encounter for supervision of other normal pregnancy, unspecified trimester: Principal | ICD-10-CM

## 2024-02-25 DIAGNOSIS — O1213 Gestational proteinuria, third trimester: Secondary | ICD-10-CM

## 2024-02-25 DIAGNOSIS — Z79899 Other long term (current) drug therapy: Secondary | ICD-10-CM | POA: Diagnosis not present

## 2024-02-25 DIAGNOSIS — R509 Fever, unspecified: Secondary | ICD-10-CM | POA: Diagnosis not present

## 2024-02-25 DIAGNOSIS — O1210 Gestational proteinuria, unspecified trimester: Secondary | ICD-10-CM | POA: Diagnosis present

## 2024-02-25 DIAGNOSIS — R109 Unspecified abdominal pain: Secondary | ICD-10-CM | POA: Insufficient documentation

## 2024-02-25 DIAGNOSIS — Z7982 Long term (current) use of aspirin: Secondary | ICD-10-CM | POA: Insufficient documentation

## 2024-02-25 LAB — CBC
HCT: 35.4 % — ABNORMAL LOW (ref 36.0–46.0)
Hemoglobin: 11.7 g/dL — ABNORMAL LOW (ref 12.0–15.0)
MCH: 28.4 pg (ref 26.0–34.0)
MCHC: 33.1 g/dL (ref 30.0–36.0)
MCV: 85.9 fL (ref 80.0–100.0)
Platelets: 225 10*3/uL (ref 150–400)
RBC: 4.12 MIL/uL (ref 3.87–5.11)
RDW: 14.8 % (ref 11.5–15.5)
WBC: 9.1 10*3/uL (ref 4.0–10.5)
nRBC: 0 % (ref 0.0–0.2)

## 2024-02-25 LAB — COMPREHENSIVE METABOLIC PANEL WITH GFR
ALT: 27 U/L (ref 0–44)
AST: 46 U/L — ABNORMAL HIGH (ref 15–41)
Albumin: 2.7 g/dL — ABNORMAL LOW (ref 3.5–5.0)
Alkaline Phosphatase: 109 U/L (ref 38–126)
Anion gap: 10 (ref 5–15)
BUN: 11 mg/dL (ref 6–20)
CO2: 19 mmol/L — ABNORMAL LOW (ref 22–32)
Calcium: 8.5 mg/dL — ABNORMAL LOW (ref 8.9–10.3)
Chloride: 106 mmol/L (ref 98–111)
Creatinine, Ser: 0.53 mg/dL (ref 0.44–1.00)
GFR, Estimated: >60 mL/min (ref >60)
Glucose, Bld: 77 mg/dL (ref 70–99)
Potassium: 3.9 mmol/L (ref 3.5–5.1)
Sodium: 135 mmol/L (ref 135–145)
Total Bilirubin: 0.5 mg/dL (ref 0.0–1.2)
Total Protein: 6.1 g/dL — ABNORMAL LOW (ref 6.5–8.1)

## 2024-02-25 LAB — URINALYSIS, COMPLETE (UACMP) WITH MICROSCOPIC
Bilirubin Urine: NEGATIVE
Glucose, UA: NEGATIVE mg/dL
Ketones, ur: NEGATIVE mg/dL
Nitrite: NEGATIVE
Protein, ur: 100 mg/dL — AB
Specific Gravity, Urine: 1.011 (ref 1.005–1.030)
pH: 6 (ref 5.0–8.0)

## 2024-02-25 NOTE — Discharge Summary (Signed)
 Physician Final Progress Note  Patient ID: Carolyn Wheeler MRN: [id number] DOB/AGE: 02-26-1991 33 y.o.  Admit date: 02/25/2024 Admitting provider: Angelita Kendall, CNM Discharge date: 02/25/2024   Admission Diagnoses:  1) intrauterine pregnancy at [redacted]w[redacted]d  2) back pain affecting pregnancy 3) proteinuria affecting pregnancy  Discharge Diagnoses:  Principal Problem:   Back pain affecting pregnancy in third trimester Active Problems:   Proteinuria affecting pregnancy   [redacted] weeks gestation of pregnancy    History of Present Illness: The patient is a 33 y.o. female [phone] at [redacted]w[redacted]d who presents for acute exacerbation of left side flank pain. Her pregnancy has been complicated by proteinuria. She is being followed by Urology. The pain started this afternoon. It is constant and sharp. She reports fetal movement. She denies leakage of fluid, contractions, vaginal bleeding. She was admitted for observation, placed on monitors, labs drawn, renal ultrasound done. Reactive NST, occasional contraction. Ultrasound report is not available at the time of patient discharge. Reviewed finding of increased volume of left kidney and suggested she follow up with urology tomorrow. She was discharged to home with instructions and precautions.   Past Medical History:  Diagnosis Date   Abnormal Pap smear of cervix 12/04/2013   ascus - pos hpv   History of Papanicolaou smear of cervix 12/04/2013; [id number]   ascus hpv +; neg -/-/-    Past Surgical History:  Procedure Laterality Date   TYMPANOSTOMY TUBE PLACEMENT     WISDOM TOOTH EXTRACTION      No current facility-administered medications on file prior to encounter.   Current Outpatient Medications on File Prior to Encounter  Medication Sig Dispense Refill   aspirin  81 MG chewable tablet Chew 1 tablet (81 mg total) by mouth daily.     Prenatal Vit-Fe Fumarate-FA (MULTIVITAMIN-PRENATAL) 27-0.8 MG TABS tablet Take 1 tablet by mouth daily at 12 noon.      Doxylamine-Pyridoxine 10-10 MG TBEC Take by mouth. 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  Tobacco Use   Smoking status: Never   Smokeless tobacco: Never  Vaping Use   Vaping status: Never Used  Substance and Sexual Activity   Alcohol use: No    Comment: former   Drug use: No   Sexual activity: Yes    Partners: Male    Birth control/protection: None  Other Topics Concern   Not on file  Social History Narrative   Not on file   Social Drivers of Health   Financial Resource Strain: Low Risk  (08/13/2023)   Overall Financial Resource Strain (CARDIA)    Difficulty of Paying Living Expenses: Not very hard  Food Insecurity: No Food Insecurity (08/13/2023)   Hunger Vital Sign    Worried About Running Out of Food in the Last Year: Never true    Ran Out of Food in the Last Year: Never true  Transportation Needs: No Transportation Needs (08/13/2023)   PRAPARE - Administrator, Civil Service (Medical): No    Lack of Transportation (Non-Medical): No  Physical Activity: Inactive (08/13/2023)   Exercise Vital Sign    Days of Exercise per Week: 0 days    Minutes of Exercise per Session: 0  min  Stress: No Stress Concern Present (08/13/2023)   [employer] of Occupational Health - Occupational Stress Questionnaire    Feeling of Stress : Not at all  Social Connections: Moderately Integrated (08/13/2023)   Social Connection and Isolation Panel [NHANES]    Frequency of Communication with Friends and Family: More than three times a week    Frequency of Social Gatherings with Friends and Family: Three times a week    Attends Religious Services: More than 4 times per year    Active Member of  Clubs or Organizations: No    Attends Banker Meetings: Never    Marital Status: Married  Catering manager Violence: Not At Risk (08/13/2023)   Humiliation, Afraid, Rape, and Kick questionnaire    Fear of Current or Ex-Partner: No    Emotionally Abused: No    Physically Abused: No    Sexually Abused: No    Family History  Problem Relation Age of Onset   Healthy Mother    Healthy Father    Healthy Brother    Healthy Brother    Healthy Brother    Healthy Maternal Grandmother    Healthy Maternal Grandfather    Healthy Paternal Grandmother    Healthy Paternal Grandfather      Review of Systems  Constitutional:  Negative for chills and fever.  HENT:  Negative for congestion, ear discharge, ear pain, hearing loss, sinus pain and sore throat.   Eyes:  Negative for blurred vision and double vision.  Respiratory:  Negative for cough, shortness of breath and wheezing.   Cardiovascular:  Negative for chest pain, palpitations and leg swelling.  Gastrointestinal:  Negative for abdominal pain, blood in stool, constipation, diarrhea, heartburn, melena, nausea and vomiting.  Genitourinary:  Negative for dysuria, flank pain, frequency, hematuria and urgency.  Musculoskeletal:  Positive for back pain. Negative for joint pain and myalgias.  Skin:  Negative for itching and rash.  Neurological:  Negative for dizziness, tingling, tremors, sensory change, speech change, focal weakness, seizures, loss of consciousness, weakness and headaches.  Endo/Heme/Allergies:  Negative for environmental allergies. Does not bruise/bleed easily.  Psychiatric/Behavioral:  Negative for depression, hallucinations, memory loss, substance abuse and suicidal ideas. The patient is not nervous/anxious and does not have insomnia.      Physical Exam: BP 118/81 (BP Location: Right Arm)   Pulse 92   Temp 98.1 F (36.7 C) (Oral)   Resp 18   Ht 5\' 6"  (1.676 m)   Wt 92.1 kg   LMP 06/06/2023 (Exact Date)    BMI 32.77 kg/m   Constitutional: Well nourished, well developed female in no acute distress.  HEENT: normal Skin: Warm and dry.  Cardiovascular: Regular rate and rhythm.   Extremity: no edema Respiratory: Clear to auscultation bilateral. Normal respiratory effort Abdomen: FHT present Back: left side flank pain, tender to palpation Psych: Alert and Oriented x3. No memory deficits. Normal mood and affect.    Fetal well being: 125 bpm, moderate variability, +accelerations, -decelerations Toco: occasional contraction  Consults: None  Significant Findings/ Diagnostic Studies: labs/imaging  Latest Reference Range & Units 02/25/24 19:16 02/25/24 20:01 02/25/24 21:25  COMPREHENSIVE METABOLIC PANEL WITH GFR   Rpt !   Sodium 135 - 145 mmol/L  135   Potassium 3.5 - 5.1 mmol/L  3.9   Chloride 98 - 111 mmol/L  106   CO2 22 - 32 mmol/L  19 (L)   Glucose 70 - 99 mg/dL  77   BUN 6 - 20 mg/dL  11   Creatinine 0.44 - 1.00 mg/dL  1.61   Calcium 8.9 - 09.6 mg/dL  8.5 (L)   Anion gap 5 - 15   10   Alkaline Phosphatase 38 - 126 U/L  109   Albumin 3.5 - 5.0 g/dL  2.7 (L)   AST 15 - 41 U/L  46 (H)   ALT 0 - 44 U/L  27   Total Protein 6.5 - 8.1 g/dL  6.1 (L)   Total Bilirubin 0.0 - 1.2 mg/dL  0.5   GFR, Estimated >04 mL/min  >60   WBC 4.0 - 10.5 K/uL  9.1   RBC 3.87 - 5.11 MIL/uL  4.12   Hemoglobin 12.0 - 15.0 g/dL  54.0 (L)   HCT 98.1 - 46.0 %  35.4 (L)   MCV 80.0 - 100.0 fL  85.9   MCH 26.0 - 34.0 pg  28.4   MCHC 30.0 - 36.0 g/dL  19.1   RDW 47.8 - 29.5 %  14.8   Platelets 150 - 400 K/uL  225   nRBC 0.0 - 0.2 %  0.0   Appearance CLEAR  HAZY !    Bilirubin Urine NEGATIVE  NEGATIVE    Color, Urine YELLOW  YELLOW !    Glucose, UA NEGATIVE mg/dL NEGATIVE    Hgb urine dipstick NEGATIVE  SMALL !    Ketones, ur NEGATIVE mg/dL NEGATIVE    Leukocytes,Ua NEGATIVE  TRACE !    Nitrite NEGATIVE  NEGATIVE    pH 5.0 - 8.0  6.0    Protein NEGATIVE mg/dL 621 !    Specific Gravity, Urine 1.005 -  1.030  1.011    Bacteria, UA NONE SEEN  RARE !    Mucus  PRESENT    RBC / HPF 0 - 5 RBC/hpf 0-5    Squamous Epithelial / HPF 0 - 5 /HPF 0-5    WBC, UA 0 - 5 WBC/hpf 0-5    US  RENAL    Rpt (IP)  !: Data is abnormal (L): Data is abnormally low (H): Data is abnormally high (IP): In Process Rpt: View report in Results Review for more information  Procedures: NST  Hospital Course: The patient was admitted to Labor and Delivery Triage for observation.   Discharge Condition: good  Disposition: Discharge disposition: 01-Home or Self Care  Diet: Regular diet  Discharge Activity: Activity as tolerated  Discharge Instructions     Discharge activity:   Complete by: As directed    As tolerated   Discharge diet:  No restrictions   Complete by: As directed       Allergies as of 02/25/2024   No Known Allergies      Medication List     STOP taking these medications    Doxylamine-Pyridoxine 10-10 MG Tbec       TAKE these medications    aspirin  81 MG chewable tablet Chew 1 tablet (81 mg total) by mouth daily.   multivitamin-prenatal 27-0.8 MG Tabs tablet Take 1 tablet by mouth daily at 12 noon.        Follow-up Information     [facility] at [facility]. Go to.   Specialty: Obstetrics and Gynecology Why: scheduled prenatal appointment Contact information: [address] [place]  [address] [phone]                Total time spent taking care of this patient: 35 minutes  Signed: Angelita Kendall, CNM  02/25/2024, 10:19 PM

## 2024-02-25 NOTE — OB Triage Note (Addendum)
 Pt is a 32yo [id number], 37w 5d. She arrived to the unit with complaints of  6/10 left mid back pain that is constant and sharp. She said that it started today at 1430 and was excruciating intolerable pain. She denies vaginal bleeding, reports positive fetal movement, and reports no contractions at this time. VS stable, monitors applied and assessing.   Initial FHT 145 at 1759.

## 2024-02-25 NOTE — Telephone Encounter (Signed)
 Patient states she has been having sharp pain in her back on the left side for the past 2-3 hours. She did feel some pressure in her pelvis. She reports baby is moving, but not as much as yesterday. She denies lof, vb. Patient spoke w/Jessica Irine Manning, CNM via phone who advised patient to report to L&D for evaluation. ?Labor vs kidney

## 2024-02-29 ENCOUNTER — Ambulatory Visit (INDEPENDENT_AMBULATORY_CARE_PROVIDER_SITE_OTHER)

## 2024-02-29 VITALS — Wt 203.0 lb

## 2024-02-29 DIAGNOSIS — Z3480 Encounter for supervision of other normal pregnancy, unspecified trimester: Secondary | ICD-10-CM

## 2024-02-29 DIAGNOSIS — M549 Dorsalgia, unspecified: Secondary | ICD-10-CM

## 2024-02-29 DIAGNOSIS — O99891 Other specified diseases and conditions complicating pregnancy: Secondary | ICD-10-CM

## 2024-02-29 DIAGNOSIS — Z3A38 38 weeks gestation of pregnancy: Secondary | ICD-10-CM | POA: Diagnosis not present

## 2024-02-29 DIAGNOSIS — O1210 Gestational proteinuria, unspecified trimester: Secondary | ICD-10-CM

## 2024-02-29 DIAGNOSIS — O1213 Gestational proteinuria, third trimester: Secondary | ICD-10-CM | POA: Diagnosis not present

## 2024-02-29 DIAGNOSIS — Z8759 Personal history of other complications of pregnancy, childbirth and the puerperium: Secondary | ICD-10-CM

## 2024-02-29 NOTE — Progress Notes (Signed)
 Carolyn Wheeler, CNM, have reviewed all documentation for this visit. The documentation on 02/29/24 for the exam, diagnosis, procedures, and orders are all accurate and complete.

## 2024-02-29 NOTE — Assessment & Plan Note (Signed)
 Recent episode and work-up showed no acute process.  Pain did resolve with rest and laying flat. Encouraged use of belly band for possible MSK contributing factor.

## 2024-02-29 NOTE — Assessment & Plan Note (Addendum)
 Continuing with Nephrology for monitoring.  3/5- 24hr urine protein: 630

## 2024-02-29 NOTE — Assessment & Plan Note (Signed)
 Doing well with no acute complaints other than back pain.  Endorses good FM and no VB, LOF, or Ctx.  Discussed comfort measures and warning signs.

## 2024-02-29 NOTE — Progress Notes (Signed)
    Return Prenatal Note   Assessment/Plan   Plan  33 y.o. [id number] at [redacted]w[redacted]d presents for follow-up OB visit. Reviewed prenatal record including previous visit note.  Proteinuria affecting pregnancy Continuing with Nephrology for monitoring.  3/5- 24hr urine protein: 630   Back pain affecting pregnancy in third trimester Recent episode and work-up showed no acute process.  Pain did resolve with rest and laying flat. Encouraged use of belly band for possible MSK contributing factor.  Supervision of other normal pregnancy, antepartum Doing well with no acute complaints other than back pain.  Endorses good FM and no VB, LOF, or Ctx.  Discussed comfort measures and warning signs.    No orders of the defined types were placed in this encounter.  Return in about 1 week (around 03/07/2024) for ROB.   Future Appointments  Date Time Provider Department Center  03/06/2024  8:55 AM [place], Sherline Distel, CNM [facility] None    For next visit:  continue with routine prenatal care     Subjective   33 y.o. [id number] at [redacted]w[redacted]d presents for this follow-up prenatal visit.  Patient reports doing well with no concerns today. Recent episode of severe back pain, now resolved.  Patient reports: Movement: Present Contractions: Not present  Objective   Flow sheet Vitals: Fundal Height: 37 cm Fetal Heart Rate (bpm): 140 Presentation: Vertex (by Leopolds) Total weight gain: 11.8 kg  General Appearance  No acute distress, well appearing, and well nourished Pulmonary   Normal work of breathing Neurologic   Alert and oriented to person, place, and time Psychiatric   Mood and affect within normal limits  [employer], Student-MidWife  02/28/2510:54 AM

## 2024-03-06 ENCOUNTER — Ambulatory Visit: Admitting: Certified Nurse Midwife

## 2024-03-06 VITALS — BP 129/88 | HR 105 | Wt 204.3 lb

## 2024-03-06 DIAGNOSIS — Z3A39 39 weeks gestation of pregnancy: Secondary | ICD-10-CM | POA: Diagnosis not present

## 2024-03-06 DIAGNOSIS — O321XX0 Maternal care for breech presentation, not applicable or unspecified: Secondary | ICD-10-CM | POA: Diagnosis not present

## 2024-03-06 DIAGNOSIS — Z3480 Encounter for supervision of other normal pregnancy, unspecified trimester: Secondary | ICD-10-CM

## 2024-03-06 NOTE — Assessment & Plan Note (Signed)
Vertex on BSUS today

## 2024-03-06 NOTE — Progress Notes (Signed)
    Return Prenatal Note   Assessment/Plan   Plan  [name] at [redacted]w[redacted]d presents for follow-up OB visit. Reviewed prenatal record including previous visit note.  Breech@36 , vertex @37  Vertex on BSUS today  Supervision of other normal pregnancy, antepartum Unable to sweep today. SVE cl/50/-3. Will desire 27 IOL if still pregnant. Will schedule at next visit.  Reviewed labor warning signs and expectations for birth. Instructed to call office or come to hospital with persistent headache, vision changes, regular contractions, leaking of fluid, decreased fetal movement or vaginal bleeding.    No orders of the defined types were placed in this encounter.  No follow-ups on file.   Future Appointments  Date Time Provider Department Center  03/10/2024  8:55 AM Shamere Dilworth, Sherline Distel, CNM AOB-A[facility] None    For next visit:  continue with routine prenatal care     Subjective   33 y.o. [id number] at [redacted]w[redacted]d presents for this follow-up prenatal visit.  Patient is very ready for baby! Patient reports: Movement: Present Contractions: Irregular  Objective   Flow sheet Vitals: Pulse Rate: (!) 105 BP: 129/88 Fundal Height: 39 cm Fetal Heart Rate (bpm): 150 Dilation: Closed Effacement (%): 50 Station: -3 Total weight gain: 27 lb 4.8 oz (12.4 kg)  General Appearance  No acute distress, well appearing, and well nourished Pulmonary   Normal work of breathing Neurologic   Alert and oriented to person, place, and time Psychiatric   Mood and affect within normal limits  Donato Fu, CNM  03/06/2509:19 AM

## 2024-03-06 NOTE — Assessment & Plan Note (Signed)
 Unable to sweep today. SVE cl/50/-3. Will desire 68 IOL if still pregnant. Will schedule at next visit.  Reviewed labor warning signs and expectations for birth. Instructed to call office or come to hospital with persistent headache, vision changes, regular contractions, leaking of fluid, decreased fetal movement or vaginal bleeding.

## 2024-03-07 ENCOUNTER — Observation Stay
Admission: EM | Admit: 2024-03-07 | Discharge: 2024-03-08 | Disposition: A | Discharge status: Home / Self Care | Attending: Obstetrics and Gynecology | Admitting: Obstetrics and Gynecology

## 2024-03-07 DIAGNOSIS — O471 False labor at or after 37 completed weeks of gestation: Principal | ICD-10-CM | POA: Insufficient documentation

## 2024-03-07 DIAGNOSIS — Z3A39 39 weeks gestation of pregnancy: Secondary | ICD-10-CM | POA: Insufficient documentation

## 2024-03-07 DIAGNOSIS — Z7982 Long term (current) use of aspirin: Secondary | ICD-10-CM | POA: Insufficient documentation

## 2024-03-07 DIAGNOSIS — O479 False labor, unspecified: Principal | ICD-10-CM | POA: Diagnosis present

## 2024-03-08 DIAGNOSIS — O479 False labor, unspecified: Principal | ICD-10-CM | POA: Diagnosis present

## 2024-03-08 DIAGNOSIS — R102 Pelvic and perineal pain: Secondary | ICD-10-CM | POA: Diagnosis not present

## 2024-03-08 DIAGNOSIS — O26893 Other specified pregnancy related conditions, third trimester: Secondary | ICD-10-CM

## 2024-03-08 DIAGNOSIS — O471 False labor at or after 37 completed weeks of gestation: Secondary | ICD-10-CM | POA: Diagnosis present

## 2024-03-08 DIAGNOSIS — Z3A39 39 weeks gestation of pregnancy: Secondary | ICD-10-CM | POA: Diagnosis not present

## 2024-03-08 DIAGNOSIS — Z7982 Long term (current) use of aspirin: Secondary | ICD-10-CM | POA: Diagnosis not present

## 2024-03-08 NOTE — OB Triage Note (Signed)
 Carolyn Wheeler 32 y.o. @G4P2  @39w3dGA   presents to Labor & Delivery triage via wheelchair steered by ED staff reporting ctx every hour and pelvic pressure. . She denies signs and symptoms consistent with rupture of membranes or active vaginal bleeding. She states positive fetal movement. External FM and TOCO applied to non-tender abdomen. Initial FHR 125. Vital signs obtained and within normal limits. Patient oriented to care environment including call bell and bed control use. Lou Rounds, CNM notified of patient's arrival.

## 2024-03-08 NOTE — Final Progress Note (Signed)
 OB/Triage Note  Patient ID: Carolyn Wheeler MRN: [id number] DOB/AGE: [birth year] 33 y.o.  Subjective  History of Present Illness: The patient is a 33 y.o. female [id number] at [redacted]w[redacted]d who presents for labor evaluation. Reports since 2030 having contractions roughly once per hour. Also reports increased pelvic pressure. Would like to have SVE, states she had SVE done in clinic earlier in the week and her cervix was closed. Endorses good FM. Denies VB, LOF, HA, visual changes or RUQ pain..   Past Medical History:  Diagnosis Date   Abnormal Pap smear of cervix 12/04/2013   ascus - pos hpv   History of Papanicolaou smear of cervix 12/04/2013; [id number]   ascus hpv +; neg -/-/-    Past Surgical History:  Procedure Laterality Date   TYMPANOSTOMY TUBE PLACEMENT     WISDOM TOOTH EXTRACTION      No current facility-administered medications on file prior to encounter.   Current Outpatient Medications on File Prior to Encounter  Medication Sig Dispense Refill   aspirin  81 MG chewable tablet Chew 1 tablet (81 mg total) by mouth daily.     Prenatal Vit-Fe Fumarate-FA (MULTIVITAMIN-PRENATAL) 27-0.8 MG TABS tablet Take 1 tablet by mouth daily at 12 noon.      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  Tobacco Use   Smoking status: Never   Smokeless tobacco: Never  Vaping Use   Vaping status: Never Used  Substance and Sexual Activity   Alcohol use: No    Comment: former   Drug use: No   Sexual activity: Yes    Partners: Male    Birth control/protection: None  Other Topics Concern   Not on file  Social History Narrative   Not on file   Social Drivers of Health   Financial Resource Strain: Low Risk  (08/13/2023)   Overall Financial Resource Strain  (CARDIA)    Difficulty of Paying Living Expenses: Not very hard  Food Insecurity: No Food Insecurity (08/13/2023)   Hunger Vital Sign    Worried About Running Out of Food in the Last Year: Never true    Ran Out of Food in the Last Year: Never true  Transportation Needs: No Transportation Needs (08/13/2023)   PRAPARE - Administrator, Civil Service (Medical): No    Lack of Transportation (Non-Medical): No  Physical Activity: Inactive (08/13/2023)   Exercise Vital Sign    Days of Exercise per Week: 0 days    Minutes of Exercise per Session: 0 min  Stress: No Stress Concern Present (08/13/2023)   [employer] of Occupational Health - Occupational Stress Questionnaire    Feeling of Stress : Not at all  Social Connections: Moderately Integrated (08/13/2023)   Social Connection and Isolation Panel [NHANES]    Frequency of Communication with Friends and Family: More than three times a week    Frequency of Social Gatherings with Friends and Family: Three times a week    Attends Religious Services: More than 4 times per year    Active Member of Clubs or Organizations: No    Attends Banker Meetings: Never    Marital Status: Married  Catering manager Violence: Not At Risk (08/13/2023)   Humiliation, Afraid, Rape, and  Kick questionnaire    Fear of Current or Ex-Partner: No    Emotionally Abused: No    Physically Abused: No    Sexually Abused: No    Family History  Problem Relation Age of Onset   Healthy Mother    Healthy Father    Healthy Brother    Healthy Brother    Healthy Brother    Healthy Maternal Grandmother    Healthy Maternal Grandfather    Healthy Paternal Grandmother    Healthy Paternal Grandfather      ROS    Objective  Physical Exam: LMP 06/06/2023 (Exact Date)   OBGyn Exam  SVE: closed, anterior  FHT 125, mod variability, pos accels, no decels Toco rare uterine contractions   Hospital Course: The patient was admitted to [facility]  Triage for observation. Had RNST, not in active labor, contractions are not painful and cervix is closed. Labor precautions discussed.  Assessment: 33 y.o. female [id number] at [redacted]w[redacted]d  Not in labor RNST  Plan: Discharge home Follow up at next ROB Teaching: labor precautions  Discharge Instructions     Discharge activity:  No Restrictions   Complete by: As directed    Discharge diet:  No restrictions   Complete by: As directed    Discharge instructions   Complete by: As directed    Follow up with your OB provider at your next ROB   LABOR:  When conractions begin, you should start to time them from the beginning of one contraction to the beginning  of the next.  When contractions are 5 - 10 minutes apart or less and have been regular for at least an hour, you should call your health care provider.   Complete by: As directed    No sexual activity restrictions   Complete by: As directed    Notify physician for bleeding from the vagina   Complete by: As directed    Notify physician for blurring of vision or spots before the eyes   Complete by: As directed    Notify physician for chills or fever   Complete by: As directed    Notify physician for fainting spells, "black outs" or loss of consciousness   Complete by: As directed    Notify physician for increase in vaginal discharge   Complete by: As directed    Notify physician for leaking of fluid   Complete by: As directed    Notify physician for pain or burning when urinating   Complete by: As directed    Notify physician for pelvic pressure (sudden increase)   Complete by: As directed    Notify physician for severe or continued nausea or vomiting   Complete by: As directed    Notify physician for sudden gushing of fluid from the vagina (with or without continued leaking)   Complete by: As directed    Notify physician for sudden, constant, or occasional abdominal pain   Complete by: As directed    Notify physician if baby moving  less than usual   Complete by: As directed       Allergies as of 03/08/2024   No Known Allergies      Medication List     TAKE these medications    aspirin  81 MG chewable tablet Chew 1 tablet (81 mg total) by mouth daily.   multivitamin-prenatal 27-0.8 MG Tabs tablet Take 1 tablet by mouth daily at 12 noon.         Total time spent taking care of this  patient: 60 minutes  Signed: Lou Rounds CNM, FNP 03/08/2024, 12:48 AM

## 2024-03-10 ENCOUNTER — Encounter: Payer: Self-pay | Admitting: Certified Nurse Midwife

## 2024-03-10 ENCOUNTER — Ambulatory Visit (INDEPENDENT_AMBULATORY_CARE_PROVIDER_SITE_OTHER): Admitting: Certified Nurse Midwife

## 2024-03-10 VITALS — BP 118/80 | HR 69 | Wt 203.6 lb

## 2024-03-10 DIAGNOSIS — Z3A39 39 weeks gestation of pregnancy: Secondary | ICD-10-CM | POA: Diagnosis not present

## 2024-03-10 DIAGNOSIS — Z3480 Encounter for supervision of other normal pregnancy, unspecified trimester: Secondary | ICD-10-CM | POA: Diagnosis not present

## 2024-03-10 NOTE — Progress Notes (Signed)
 ROB. She states daily fetal movement, denies contractions after recent L&D visit. Would like cervical check today.

## 2024-03-10 NOTE — Assessment & Plan Note (Signed)
 SVE today - cl/50/-3.  Had a hard weekend with discomfort. Has decided to stop working and just rest to get ready for baby.  Postdates IOL scheduled for 5/21 at 8am. Reviewed where and when to come to hospital. Reviewed NST with next visit. Reviewed labor warning signs and expectations for birth. Instructed to call office or come to hospital with persistent headache, vision changes, regular contractions, leaking of fluid, decreased fetal movement or vaginal bleeding.

## 2024-03-10 NOTE — Progress Notes (Signed)
    Return Prenatal Note   Assessment/Plan   Plan  33 y.o. [id number] at [redacted]w[redacted]d presents for follow-up OB visit. Reviewed prenatal record including previous visit note.  Supervision of other normal pregnancy, antepartum SVE today - cl/50/-3.  Had a hard weekend with discomfort. Has decided to stop working and just rest to get ready for baby.  Postdates IOL scheduled for 5/21 at 8am. Reviewed where and when to come to hospital. Reviewed NST with next visit. Reviewed labor warning signs and expectations for birth. Instructed to call office or come to hospital with persistent headache, vision changes, regular contractions, leaking of fluid, decreased fetal movement or vaginal bleeding.    No orders of the defined types were placed in this encounter.  No follow-ups on file.   Future Appointments  Date Time Provider Department Center  03/17/2024  9:15 AM AOB-NST ROOM AOB-AOB None  03/17/2024  9:55 AM Free, Verita Glassman, CNM AOB-A[facility] None    For next visit:  ROB/NST on Monday or Tuesday     Subjective   32 y.o. [id number] at [redacted]w[redacted]d presents for this follow-up prenatal visit.  Patient is very ready for baby.  Patient reports: Movement: Present  Objective   Flow sheet Vitals: Pulse Rate: 69 BP: 118/80 Fundal Height: 39 cm Fetal Heart Rate (bpm): 140 Presentation: Vertex (Leopolds) Dilation: Closed Effacement (%): 50 Station: -3 Total weight gain: 26 lb 9.6 oz (12.1 kg)  General Appearance  No acute distress, well appearing, and well nourished Pulmonary   Normal work of breathing Neurologic   Alert and oriented to person, place, and time Psychiatric   Mood and affect within normal limits  Donato Fu, CNM  05/12/259:44 AM

## 2024-03-16 ENCOUNTER — Inpatient Hospital Stay
Admission: EM | Admit: 2024-03-16 | Discharge: 2024-03-17 | DRG: 807 | Disposition: A | Discharge status: Home / Self Care | Attending: Family | Admitting: Family

## 2024-03-16 ENCOUNTER — Encounter: Payer: Self-pay | Admitting: Obstetrics and Gynecology

## 2024-03-16 ENCOUNTER — Other Ambulatory Visit: Payer: Self-pay

## 2024-03-16 DIAGNOSIS — O480 Post-term pregnancy: Secondary | ICD-10-CM

## 2024-03-16 DIAGNOSIS — Z3A40 40 weeks gestation of pregnancy: Secondary | ICD-10-CM

## 2024-03-16 DIAGNOSIS — O321XX0 Maternal care for breech presentation, not applicable or unspecified: Secondary | ICD-10-CM

## 2024-03-16 DIAGNOSIS — Z3480 Encounter for supervision of other normal pregnancy, unspecified trimester: Principal | ICD-10-CM

## 2024-03-16 DIAGNOSIS — O320XX0 Maternal care for unstable lie, not applicable or unspecified: Secondary | ICD-10-CM

## 2024-03-16 DIAGNOSIS — O26893 Other specified pregnancy related conditions, third trimester: Secondary | ICD-10-CM | POA: Diagnosis present

## 2024-03-16 DIAGNOSIS — O99891 Other specified diseases and conditions complicating pregnancy: Secondary | ICD-10-CM

## 2024-03-16 DIAGNOSIS — O1210 Gestational proteinuria, unspecified trimester: Secondary | ICD-10-CM

## 2024-03-16 DIAGNOSIS — M549 Dorsalgia, unspecified: Secondary | ICD-10-CM

## 2024-03-16 LAB — CBC
HCT: 36.6 % (ref 36.0–46.0)
Hemoglobin: 12.1 g/dL (ref 12.0–15.0)
MCH: 28.3 pg (ref 26.0–34.0)
MCHC: 33.1 g/dL (ref 30.0–36.0)
MCV: 85.7 fL (ref 80.0–100.0)
Platelets: 231 10*3/uL (ref 150–400)
RBC: 4.27 MIL/uL (ref 3.87–5.11)
RDW: 15.9 % — ABNORMAL HIGH (ref 11.5–15.5)
WBC: 11.8 10*3/uL — ABNORMAL HIGH (ref 4.0–10.5)
nRBC: 0 % (ref 0.0–0.2)

## 2024-03-16 LAB — TYPE AND SCREEN
ABO/RH(D): O POS
Antibody Screen: NEGATIVE

## 2024-03-16 MED ORDER — LACTATED RINGERS IV SOLN: 500.0000 mL | INTRAVENOUS | Status: DC | PRN

## 2024-03-16 MED ORDER — PRENATAL MULTIVITAMIN CH
1.0000 | ORAL_TABLET | Freq: Every day | ORAL | Status: DC
  Administered 2024-03-17: 1 via ORAL
  Filled 2024-03-16: qty 1

## 2024-03-16 MED ORDER — AMMONIA AROMATIC IN INHA
RESPIRATORY_TRACT | Status: DC
Start: 2024-03-16 — End: 2024-03-16
  Filled 2024-03-16: qty 10

## 2024-03-16 MED ORDER — FENTANYL CITRATE (PF) 100 MCG/2ML IJ SOLN
50.0000 ug | INTRAMUSCULAR | Status: DC | PRN
  Administered 2024-03-16: 100 ug via INTRAVENOUS
  Filled 2024-03-16: qty 2

## 2024-03-16 MED ORDER — IBUPROFEN 600 MG PO TABS
600.0000 mg | ORAL_TABLET | Freq: Four times a day (QID) | ORAL | Status: DC
  Administered 2024-03-16 – 2024-03-17 (×4): 600 mg via ORAL
  Filled 2024-03-16 (×4): qty 1

## 2024-03-16 MED ORDER — COCONUT OIL OIL: 1.0000 | TOPICAL_OIL | Status: DC | PRN

## 2024-03-16 MED ORDER — ACETAMINOPHEN 325 MG PO TABS: 650.0000 mg | ORAL_TABLET | ORAL | Status: DC | PRN

## 2024-03-16 MED ORDER — ONDANSETRON HCL 4 MG PO TABS: 4.0000 mg | ORAL_TABLET | ORAL | Status: DC | PRN

## 2024-03-16 MED ORDER — SOD CITRATE-CITRIC ACID 500-334 MG/5ML PO SOLN: 30.0000 mL | ORAL | Status: DC | PRN

## 2024-03-16 MED ORDER — MISOPROSTOL 200 MCG PO TABS
ORAL_TABLET | ORAL | Status: AC
  Filled 2024-03-16: qty 4

## 2024-03-16 MED ORDER — BENZOCAINE-MENTHOL 20-0.5 % EX AERO
1.0000 | INHALATION_SPRAY | CUTANEOUS | Status: DC | PRN
  Administered 2024-03-16 – 2024-03-17 (×2): 1 via TOPICAL
  Filled 2024-03-16 (×2): qty 56

## 2024-03-16 MED ORDER — SIMETHICONE 80 MG PO CHEW: 80.0000 mg | CHEWABLE_TABLET | ORAL | Status: DC | PRN

## 2024-03-16 MED ORDER — SENNOSIDES-DOCUSATE SODIUM 8.6-50 MG PO TABS
2.0000 | ORAL_TABLET | Freq: Every day | ORAL | Status: DC
  Administered 2024-03-17: 2 via ORAL
  Filled 2024-03-16: qty 2

## 2024-03-16 MED ORDER — DIBUCAINE (PERIANAL) 1 % EX OINT
1.0000 | TOPICAL_OINTMENT | CUTANEOUS | Status: DC | PRN
  Administered 2024-03-16: 1 via RECTAL
  Filled 2024-03-16: qty 28

## 2024-03-16 MED ORDER — OXYTOCIN 10 UNIT/ML IJ SOLN
INTRAMUSCULAR | Status: AC
  Filled 2024-03-16: qty 2

## 2024-03-16 MED ORDER — TETANUS-DIPHTH-ACELL PERTUSSIS 5-2.5-18.5 LF-MCG/0.5 IM SUSY
0.5000 mL | PREFILLED_SYRINGE | Freq: Once | INTRAMUSCULAR | Status: DC
  Filled 2024-03-16: qty 0.5

## 2024-03-16 MED ORDER — TERBUTALINE SULFATE 1 MG/ML IJ SOLN: 0.2500 mg | Freq: Once | INTRAMUSCULAR | Status: DC | PRN

## 2024-03-16 MED ORDER — WITCH HAZEL-GLYCERIN EX PADS
1.0000 | MEDICATED_PAD | CUTANEOUS | Status: DC | PRN
  Administered 2024-03-16: 1 via TOPICAL
  Filled 2024-03-16: qty 100

## 2024-03-16 MED ORDER — OXYTOCIN-SODIUM CHLORIDE 30-0.9 UT/500ML-% IV SOLN
1.0000 m[IU]/min | INTRAVENOUS | Status: DC
  Administered 2024-03-16: 2 m[IU]/min via INTRAVENOUS

## 2024-03-16 MED ORDER — ONDANSETRON HCL 4 MG/2ML IJ SOLN: 4.0000 mg | INTRAMUSCULAR | Status: DC | PRN

## 2024-03-16 MED ORDER — DIPHENHYDRAMINE HCL 25 MG PO CAPS: 25.0000 mg | ORAL_CAPSULE | Freq: Four times a day (QID) | ORAL | Status: DC | PRN

## 2024-03-16 MED ORDER — ACETAMINOPHEN 325 MG PO TABS
650.0000 mg | ORAL_TABLET | ORAL | Status: DC | PRN
  Administered 2024-03-17: 650 mg via ORAL
  Filled 2024-03-16: qty 2

## 2024-03-16 MED ORDER — LIDOCAINE HCL (PF) 1 % IJ SOLN
30.0000 mL | INTRAMUSCULAR | Status: DC | PRN
  Filled 2024-03-16: qty 30

## 2024-03-16 MED ORDER — OXYTOCIN-SODIUM CHLORIDE 30-0.9 UT/500ML-% IV SOLN
2.5000 [IU]/h | INTRAVENOUS | Status: DC
  Filled 2024-03-16: qty 500

## 2024-03-16 MED ORDER — OXYTOCIN BOLUS FROM INFUSION
333.0000 mL | Freq: Once | INTRAVENOUS | Status: AC
  Administered 2024-03-16: 333 mL via INTRAVENOUS

## 2024-03-16 MED ORDER — ONDANSETRON HCL 4 MG/2ML IJ SOLN: 4.0000 mg | Freq: Four times a day (QID) | INTRAMUSCULAR | Status: DC | PRN

## 2024-03-16 MED ORDER — LACTATED RINGERS IV SOLN: INTRAVENOUS | Status: DC

## 2024-03-16 NOTE — Discharge Summary (Signed)
 OB Discharge Summary     Patient Name: Carolyn Wheeler DOB: 29-Jan-1991 MRN: [id number]  Date of admission: 03/16/2024 Delivering MD: Donato Fu, CNM  Date of Delivery: 03/17/2024  Date of discharge: 03/17/2024  Admitting diagnosis: Labor and delivery, indication for care [O75.9] Intrauterine pregnancy: [redacted]w[redacted]d     Secondary diagnosis: None     Discharge diagnosis: Term Pregnancy Delivered                                                                                                Post partum procedures:none  Augmentation: Pitocin   Complications: None  Hospital course:  Onset of Labor With Vaginal Delivery      33 y.o. yo [id number] at [redacted]w[redacted]d was admitted in Latent Labor on 03/16/2024. Labor course was complicated by MSAF.  Membrane Rupture Time/Date: 1:37 PM,03/16/2024  Delivery Method:Vaginal, Spontaneous Operative Delivery:N/A Episiotomy: None Lacerations:  None  Patient had a postpartum course complicated by none.  She is ambulating, tolerating a regular diet, passing flatus, and urinating well. Patient is discharged home in stable condition on 03/17/24.  Newborn Data: Birth date:03/16/2024 Birth time:2:20 PM Gender:Female Living status:Living Apgars:8 ,9  Weight:3820 g  Subjective:   Physical exam  Vitals:   03/16/24 1928 03/17/24 0009 03/17/24 0324 03/17/24 0900  BP: 115/72 116/75 106/62 108/72  Pulse: (!) 101 84 94 88  Resp: 18 18 18 20   Temp: 98.2 F (36.8 C) 97.9 F (36.6 C) 97.9 F (36.6 C) 98 F (36.7 C)  TempSrc: Oral Oral Oral Oral  SpO2: 97% 96% 98% 100%  Weight:      Height:       General: alert Breast: soft, non-tender, nipples without breakdown Lochia: appropriate Uterine Fundus: firm Perineum: no erythema or foul odor discharge, minimal edema DVT Evaluation: No evidence of DVT seen on physical exam.  Labs: Lab Results  Component Value Date   WBC 13.2 (H) 03/17/2024   HGB 10.2 (L) 03/17/2024   HCT 31.6 (L) 03/17/2024    MCV 86.8 03/17/2024   PLT 222 03/17/2024    Discharge instruction: in After Visit Summary.  Medications:  Allergies as of 03/17/2024   No Known Allergies      Medication List     STOP taking these medications    aspirin  81 MG chewable tablet       TAKE these medications    acetaminophen  325 MG tablet Commonly known as: Tylenol  Take 2 tablets (650 mg total) by mouth every 4 (four) hours as needed (for pain scale < 4).   ibuprofen  600 MG tablet Commonly known as: ADVIL  Take 1 tablet (600 mg total) by mouth every 6 (six) hours.   multivitamin-prenatal 27-0.8 MG Tabs tablet Take 1 tablet by mouth daily at 12 noon.         Activity: Advance as tolerated. Pelvic rest for 6 weeks.   Outpatient follow up:  Follow-up Information     [facility] Follow up.   Specialty: Obstetrics and Gynecology Why: call to schedule a two week telehealth visit and six week in person postpartum visit  Contact information: [address] [place]  [address] [phone]                  Postpartum contraception: Combination OCPs Rhogam Given postpartum: not indicated Rubella vaccine given postpartum: not indicated Varicella vaccine given postpartum: not indicated TDaP given antepartum or postpartum: received 12/28/23  Newborn Data: Live born female  Birth Weight:   APGAR: 8, 9  Newborn Delivery   Birth date/time: 03/16/2024 14:20:47 Delivery type: Vaginal, Spontaneous      Baby Feeding: Breast  Disposition:home with mother   Lou Rounds, CNM, FNP 03/17/2024 1:29 PM

## 2024-03-16 NOTE — Progress Notes (Signed)
  Labor Progress Note   33 y.o. [id number] @ [redacted]w[redacted]d   Subjective:  Patient not feeling painful contractions, they have spaced out since arrival. States last contraction was about ago and was not painful  Objective:  BP 121/86 (BP Location: Left Arm)   Pulse 81   Temp 98.2 F (36.8 C) (Oral)   Resp 16   Ht 5\' 6"  (1.676 m)   Wt 92.4 kg   LMP 06/06/2023 (Exact Date)   BMI 32.86 kg/m  Abd: gravid SVE: 8cm/80%/-2, vertex  EFM: 135, mod variability, pos accels, no decels Toco: Ucs roughly every or more with some uterine irritability, patient states contractions are not currently painful   Assessment  G4P2012 @ [redacted]w[redacted]d Advanced dilation VSS FHR Cat I   Plan:   1. Despite contractions spacing out and decreasing in intensity, Niaja has progressed in cervical dilation. Due to advanced dilation offered to augment labor. Rily in agreement. Will start low dose pitocin , anticipate NSVB soon.  All discussed with patient  Lou Rounds CNM, FNP [facility] 03/16/2024  9:56 AM

## 2024-03-16 NOTE — H&P (Signed)
 History and Physical   HPI  Carolyn Wheeler is a 33 y.o. [id number] at [redacted]w[redacted]d Estimated Date of Delivery: 03/12/24 who is being admitted for labor management.    OB History  OB History  Gravida Para Term Preterm AB Living  4 2 2  0 1 2  SAB IAB Ectopic Multiple Live Births  1 0 0 0 2    # Outcome Date GA Lbr Len/2nd Weight Sex Type Anes PTL Lv  4 Current           3 SAB 08/2022          2 Term 06/22/18 [redacted]w[redacted]d  3345 g F Vag-Spont   LIV     Name: Carolyn Wheeler  1 Term 06/21/14 [redacted]w[redacted]d  4082 g M Vag-Spont   LIV     Name: Carolyn Wheeler    PROBLEM LIST  Pregnancy complications or risks: Patient Active Problem List   Diagnosis Date Noted   Back pain affecting pregnancy in third trimester 02/25/2024   Unstable fetal lie 02/21/2024   Breech@36 , vertex @37  02/13/2024   Left flank pain 01/16/2024   History of gestational hypertension 11/08/2023   Proteinuria affecting pregnancy 10/11/2023   Supervision of other normal pregnancy, antepartum 08/13/2023   Proteinuria 06/17/2018    Prenatal labs and studies: ABO, Rh: O/Positive/-- (11/11 1050) Antibody: Negative (11/11 1050) Rubella: 1.32 (11/11 1050) RPR: Non Reactive (02/17 1027)  HBsAg: Negative (11/11 1050)  HIV: Non Reactive (02/17 1027)  JWJ:XBJYNWGN/-- (04/16 1047)   Past Medical History:  Diagnosis Date   Abnormal Pap smear of cervix 12/04/2013   [facility] - pos hpv   History of Papanicolaou smear of cervix 12/04/2013; [id number]   [facility] hpv +; neg -/-/-     Past Surgical History:  Procedure Laterality Date   TYMPANOSTOMY TUBE PLACEMENT     WISDOM TOOTH EXTRACTION       Medications    Current Discharge Medication List     CONTINUE these medications which have NOT CHANGED   Details  aspirin  81 MG chewable tablet Wheeler 1 tablet (81 mg total) by mouth daily.    Prenatal Vit-Fe Fumarate-FA (MULTIVITAMIN-PRENATAL) 27-0.8 MG TABS tablet Take 1 tablet by mouth daily at 12 noon.         Allergies  Patient has no  known allergies.  Review of Systems  Constitutional: negative Eyes: negative Ears, nose, mouth, throat, and face: negative Respiratory: negative Cardiovascular: negative Gastrointestinal: negative Genitourinary:negative Integument/breast: negative Hematologic/lymphatic: negative Musculoskeletal:negative Neurological: negative Behavioral/Psych: negative Endocrine: negative Allergic/Immunologic: negative  Physical Exam  LMP 06/06/2023 (Exact Date)   Lungs:  CTA B Cardio: RRR without M/R/G Abd: Soft, gravid, NT Presentation: cephalic EXT: No C/C/ 1+ Edema DTRs: 2+ B CERVIX: Dilation: 6 Effacement (%): 70 Cervical Position: Posterior Station: -2, -3 Presentation: Vertex Exam by:: RRL  See Prenatal records for more detailed PE.     FHR:  Baseline: 130 bpm, Variability: Good {> 6 bpm), Accelerations: Non-reactive but appropriate for gestational age, and Decelerations: Absent  Toco: Uterine Contractions: Frequency: Every 2-3 minutes  Test Results  No results found for this or any previous visit (from the past 24 hours). Group B Strep negative  Assessment   G4P2012 at [redacted]w[redacted]d Estimated Date of Delivery: 03/12/24  The fetus is reassuring.   Patient Active Problem List   Diagnosis Date Noted   Back pain affecting pregnancy in third trimester 02/25/2024   Unstable fetal lie 02/21/2024   Breech@36 , vertex @37  02/13/2024   Left  flank pain 01/16/2024   History of gestational hypertension 11/08/2023   Proteinuria affecting pregnancy 10/11/2023   Supervision of other normal pregnancy, antepartum 08/13/2023   Proteinuria 06/17/2018    Plan  1. Admit to L&D :   2. EFM: -- Category 1 3. IV pain medication or Epidural if desired.   4. Admission labs  5. Anticipate NSVD 6. Dr. Alvia Awkward notified of pt admission  Alise Appl, [facility]  03/16/2024 5:45 AM

## 2024-03-17 ENCOUNTER — Encounter: Payer: Self-pay | Admitting: Certified Nurse Midwife

## 2024-03-17 ENCOUNTER — Encounter

## 2024-03-17 ENCOUNTER — Other Ambulatory Visit

## 2024-03-17 DIAGNOSIS — Z3480 Encounter for supervision of other normal pregnancy, unspecified trimester: Secondary | ICD-10-CM

## 2024-03-17 DIAGNOSIS — Z3A40 40 weeks gestation of pregnancy: Secondary | ICD-10-CM

## 2024-03-17 LAB — CBC
HCT: 31.6 % — ABNORMAL LOW (ref 36.0–46.0)
Hemoglobin: 10.2 g/dL — ABNORMAL LOW (ref 12.0–15.0)
MCH: 28 pg (ref 26.0–34.0)
MCHC: 32.3 g/dL (ref 30.0–36.0)
MCV: 86.8 fL (ref 80.0–100.0)
Platelets: 222 10*3/uL (ref 150–400)
RBC: 3.64 MIL/uL — ABNORMAL LOW (ref 3.87–5.11)
RDW: 15.8 % — ABNORMAL HIGH (ref 11.5–15.5)
WBC: 13.2 10*3/uL — ABNORMAL HIGH (ref 4.0–10.5)
nRBC: 0 % (ref 0.0–0.2)

## 2024-03-17 LAB — RPR: RPR Ser Ql: NONREACTIVE

## 2024-03-17 MED ORDER — ACETAMINOPHEN 325 MG PO TABS: 650.0000 mg | ORAL_TABLET | ORAL | Status: AC | PRN

## 2024-03-17 MED ORDER — IBUPROFEN 600 MG PO TABS: 600.0000 mg | ORAL_TABLET | Freq: Four times a day (QID) | ORAL | 0 refills | Status: AC

## 2024-03-17 NOTE — Discharge Instructions (Signed)

## 2024-04-28 ENCOUNTER — Ambulatory Visit (INDEPENDENT_AMBULATORY_CARE_PROVIDER_SITE_OTHER): Admitting: Certified Nurse Midwife

## 2024-04-28 DIAGNOSIS — Z1332 Encounter for screening for maternal depression: Secondary | ICD-10-CM | POA: Diagnosis not present

## 2024-04-28 NOTE — Progress Notes (Signed)
 Post Partum Visit Note  Carolyn Wheeler is a 33 y.o. [id number] female who presents for a postpartum visit. She is 6 weeks postpartum following a normal spontaneous vaginal delivery.  I have fully reviewed the prenatal and intrapartum course. The delivery was at 40+4 gestational weeks.  Anesthesia: none. Postpartum course has been uncomplicated. Baby boy Mabel is doing well, though only sleeping 2h at a time overnight between feedings. Baby is feeding by bottle - Similac Advance. Bleeding no bleeding. Bowel function is normal. Bladder function is normal. Patient is not sexually active. Contraception method is vasectomy, consult today. Postpartum depression screening: negative.   Upstream - 04/28/24 0931       Pregnancy Intention Screening   Does the patient want to become pregnant in the next year? No    Does the patient's partner want to become pregnant in the next year? No    Would the patient like to discuss contraceptive options today? No      Contraception Wrap Up   Current Method Vasectomy    Contraception Counseling Provided No    How was the end contraceptive method provided? N/A         The pregnancy intention screening data noted above was reviewed. Potential methods of contraception were discussed. The patient elected to proceed with No data recorded.   Edinburgh Postnatal Depression Scale - 04/28/24 0931       Edinburgh Postnatal Depression Scale:  In the Past 7 Days   I have been able to laugh and see the funny side of things. 0    I have looked forward with enjoyment to things. 0    I have blamed myself unnecessarily when things went wrong. 0    I have been anxious or worried for no good reason. 0    I have felt scared or panicky for no good reason. 0    Things have been getting on top of me. 0    I have been so unhappy that I have had difficulty sleeping. 0    I have felt sad or miserable. 0    I have been so unhappy that I have been crying. 0    The thought  of harming myself has occurred to me. 0    Edinburgh Postnatal Depression Scale Total 0          Health Maintenance Due  Topic Date Due   Pneumococcal Vaccine 44-39 Years old (1 of 2 - PCV) Never done   Hepatitis B Vaccines (1 of 3 - 19+ 3-dose series) Never done   HPV VACCINES (1 - 3-dose SCDM series) Never done   COVID-19 Vaccine (2 - 2024-25 season) 07/01/2023    The following portions of the patient's history were reviewed and updated as appropriate: allergies, current medications, past family history, past medical history, past social history, past surgical history, and problem list.  Review of Systems Pertinent items are noted in HPI.  Objective:  BP 121/86   Pulse 78   Ht 5' 6 (1.676 m)   Wt 187 lb 14.4 oz (85.2 kg)   Breastfeeding No   BMI 30.33 kg/m    General:  alert, cooperative, appears stated age, and no distress   Breasts:  normal  Lungs: clear to auscultation bilaterally  Heart:  regular rate and rhythm, S1, S2 normal, no murmur, click, rub or gallop  Abdomen: soft, non-tender; bowel sounds normal; no masses,  no organomegaly        Assessment:  1. Routine postpartum follow-up   2. Encounter for screening for maternal depression   3. Postpartum care and examination of lactating mother    Normal postpartum exam.   Plan:   Essential components of care per ACOG recommendations:  1.  Mood and well being: Patient with negative depression screening today. Reviewed local resources for support.  - Patient tobacco use? No.   - hx of drug use? No.    2. Infant care and feeding:  -Patient currently breastmilk feeding? No.  -Social determinants of health (SDOH) reviewed in EPIC. No concerns  3. Sexuality, contraception and birth spacing - Patient does not want a pregnancy in the next year.  Desired family size is 3 children.  - Reviewed reproductive life planning. Reviewed contraceptive methods based on pt preferences and effectiveness.  Patient &  partner planning- Vasectomy, consult today. Reviewed need for backup method after procedure. - Discussed birth spacing of 18 months  4. Sleep and fatigue -Planning to change formula to see if baby is able to tolerate formula and sleep better.  5. Physical Recovery  - Discussed patients delivery and complications. She describes her labor as mixed. - Patient had a Vaginal, no problems at delivery. Patient had no laceration. Perineal healing reviewed. Patient expressed understanding - Patient has urinary incontinence? No. - Patient is safe to resume physical and sexual activity  6.  Health Maintenance - HM due items addressed Yes - Last pap smear  Diagnosis  Date Value Ref Range Status  02/14/2021   Final   - Negative for intraepithelial lesion or malignancy (NILM)   Pap smear not done at today's visit.  -Breast Cancer screening indicated? No.   7. Chronic Disease/Pregnancy Condition follow up: None  - PCP follow up  Harlene LITTIE Cisco, CNM [facility], [facility]

## 2024-06-09 ENCOUNTER — Encounter (INDEPENDENT_AMBULATORY_CARE_PROVIDER_SITE_OTHER): Payer: Self-pay

## 2024-06-10 ENCOUNTER — Encounter (INDEPENDENT_AMBULATORY_CARE_PROVIDER_SITE_OTHER): Payer: Self-pay | Admitting: Adult Health

## 2024-06-10 ENCOUNTER — Ambulatory Visit (INDEPENDENT_AMBULATORY_CARE_PROVIDER_SITE_OTHER): Admitting: Adult Health

## 2024-06-10 VITALS — BP 104/68 | HR 83 | Temp 98.2°F | Ht 62.5 in | Wt 183.0 lb

## 2024-06-10 DIAGNOSIS — Z0000 Encounter for general adult medical examination without abnormal findings: Secondary | ICD-10-CM

## 2024-06-10 DIAGNOSIS — R748 Abnormal levels of other serum enzymes: Secondary | ICD-10-CM

## 2024-06-10 DIAGNOSIS — E669 Obesity, unspecified: Secondary | ICD-10-CM | POA: Diagnosis not present

## 2024-06-10 DIAGNOSIS — Z0289 Encounter for other administrative examinations: Secondary | ICD-10-CM

## 2024-06-10 DIAGNOSIS — Z8759 Personal history of other complications of pregnancy, childbirth and the puerperium: Secondary | ICD-10-CM | POA: Diagnosis not present

## 2024-06-10 NOTE — Progress Notes (Signed)
 Office: [phone]  /  Fax: [phone]   Initial Visit    Carolyn Wheeler was seen in clinic today to evaluate for obesity. She is interested in losing weight to improve overall health and reduce the risk of weight related complications. She presents today to review program treatment options, initial physical assessment, and evaluation.     She was referred by: Friend or Family  When asked what else they would like to accomplish? She states: Adopt a healthier eating pattern and lifestyle, Improve energy levels and physical activity, Improve existing medical conditions, Improve quality of life, and Current Weight 183 lbs with Goal Weight 140-150 lbs  When asked how has your weight affected you? She states: Contributed to orthopedic problems or mobility issues, Having fatigue, and Having poor endurance  Weight history: Unexplained weight gain over the last 12 months- started prior to her last pregnancy- youngest child is 21 months old  Highest weight: 196 lbs  Some associated conditions: Other: Elevated Liver Enzymes  Contributing factors: consumption of processed foods, moderate to high levels of stress, reduced physical activity, and need for convenient foods  Weight promoting medications identified: None  Prior weight loss attempts: Tracking and Journaling and Intermittent fasting(IF)  Current nutrition plan: Portion control / smart choices and Other: IF  Current level of physical activity: Walking 60 minutes, three a week  Current or previous pharmacotherapy: None  Response to medication: Never tried medications   Past medical history includes:   Past Medical History:  Diagnosis Date   Abnormal Pap smear of cervix 12/04/2013   [facility] - pos hpv   History of Papanicolaou smear of cervix 12/04/2013; [id number]   [facility] hpv +; neg -/-/-     Objective    BP 104/68   Pulse 83   Temp 98.2 F (36.8 C)   Ht 5' 2.5 (1.588 m)   Wt 183 lb (83 kg)   SpO2 98%   BMI  32.94 kg/m  She was weighed on the bioimpedance scale: Body mass index is 32.94 kg/m.  Body Fat%:40.3, Visceral Fat Rating:8, Weight trend over the last 12 months: Increasing  General:  Alert, oriented and cooperative. Patient is in no acute distress.  Respiratory: Normal respiratory effort, no problems with respiration noted   Gait: able to ambulate independently  Mental Status: Normal mood and affect. Normal behavior. Normal judgment and thought content.   DIAGNOSTIC DATA REVIEWED:  BMET    Component Value Date/Time   NA 135 02/25/2024 2001   NA 139 09/10/2023 1050   K 3.9 02/25/2024 2001   CL 106 02/25/2024 2001   CO2 19 (L) 02/25/2024 2001   GLUCOSE 77 02/25/2024 2001   BUN 11 02/25/2024 2001   BUN 8 09/10/2023 1050   CREATININE 0.53 02/25/2024 2001   CALCIUM 8.5 (L) 02/25/2024 2001   GFRNONAA >60 02/25/2024 2001   GFRAA >60 06/22/2018 0835   Lab Results  Component Value Date   HGBA1C 5.2 09/10/2023   HGBA1C 5.4 02/14/2021   No results found for: INSULIN CBC    Component Value Date/Time   WBC 13.2 (H) 03/17/2024 0546   RBC 3.64 (L) 03/17/2024 0546   HGB 10.2 (L) 03/17/2024 0546   HGB 12.1 12/17/2023 1027   HCT 31.6 (L) 03/17/2024 0546   HCT 37.3 12/17/2023 1027   PLT 222 03/17/2024 0546   PLT 294 12/17/2023 1027   MCV 86.8 03/17/2024 0546   MCV 91 12/17/2023 1027   MCV 84 06/21/2014 0916   MCH  28.0 03/17/2024 0546   MCHC 32.3 03/17/2024 0546   RDW 15.8 (H) 03/17/2024 0546   RDW 13.3 12/17/2023 1027   RDW 15.9 (H) 06/21/2014 0916   Iron/TIBC/Ferritin/ %Sat No results found for: IRON, TIBC, FERRITIN, IRONPCTSAT Lipid Panel  No results found for: CHOL, TRIG, HDL, CHOLHDL, VLDL, LDLCALC, LDLDIRECT Hepatic Function Panel     Component Value Date/Time   PROT 6.1 (L) 02/25/2024 2001   PROT 6.6 09/10/2023 1050   ALBUMIN 2.7 (L) 02/25/2024 2001   ALBUMIN 3.8 (L) 09/10/2023 1050   AST 46 (H) 02/25/2024 2001   ALT 27 02/25/2024 2001    ALKPHOS 109 02/25/2024 2001   BILITOT 0.5 02/25/2024 2001   BILITOT 0.2 09/10/2023 1050   No results found for: TSH   Assessment and Plan   Healthcare maintenance  History of gestational hypertension  Elevated liver enzymes  Obesity (BMI 30-39.9), STARTING BMI 33.0    Assessment and Plan          ESTABLISH WITH [facility]   Obesity Treatment / Action Plan:  Patient will work on garnering support from family and friends to begin weight loss journey. Will work on eliminating or reducing the presence of highly palatable, calorie dense foods in the home. Will complete provided nutritional and psychosocial assessment questionnaire before the next appointment. Will be scheduled for indirect calorimetry to determine resting energy expenditure in a fasting state.  This will allow us  to create a reduced calorie, high-protein meal plan to promote loss of fat mass while preserving muscle mass. Counseled on the health benefits of losing 5%-15% of total body weight. Was counseled on nutritional approaches to weight loss and benefits of reducing processed foods and consuming plant-based foods and high quality protein as part of nutritional weight management. Was counseled on pharmacotherapy and role as an adjunct in weight management.   Obesity Education Performed Today:  She was weighed on the bioimpedance scale and results were discussed and documented in the synopsis.  We discussed obesity as a disease and the importance of a more detailed evaluation of all the factors contributing to the disease.  We discussed the importance of long term lifestyle changes which include nutrition, exercise and behavioral modifications as well as the importance of customizing this to her specific health and social needs.  We discussed the benefits of reaching a healthier weight to alleviate the symptoms of existing conditions and reduce the risks of the biomechanical, metabolic and psychological effects  of obesity.  We reviewed the four pillars of obesity medicine and importance of using a multimodal approach.  We reviewed the basic principles in weight management.   Carolyn Wheeler appears to be in the action stage of change and states they are ready to start intensive lifestyle modifications and behavioral modifications.  I have spent 25 minutes in the care of the patient today including: 2 minutes before the visit reviewing and preparing the chart. 20 minutes face-to-face assessing and reviewing listed medical problems as outlined in obesity care plan, providing nutritional and behavioral counseling on topics outlined in the obesity care plan, counseling regarding anti-obesity medication as outlined in obesity care plan, independently interpreting test results and goals of care, as described in assessment and plan, and reviewing and discussing biometric information and progress 3 minutes after the visit updating chart and documentation of encounter.  Reviewed by clinician on day of visit: allergies, medications, problem list, medical history, surgical history, family history, social history, and previous encounter notes pertinent to obesity diagnosis.  Jude Linck d. Kimm Ungaro, NP-C

## 2024-06-12 ENCOUNTER — Encounter (INDEPENDENT_AMBULATORY_CARE_PROVIDER_SITE_OTHER): Payer: Self-pay | Admitting: Nurse Practitioner

## 2024-06-12 ENCOUNTER — Ambulatory Visit (INDEPENDENT_AMBULATORY_CARE_PROVIDER_SITE_OTHER): Admitting: Nurse Practitioner

## 2024-06-12 VITALS — BP 97/62 | HR 60 | Temp 98.3°F | Ht 62.0 in | Wt 181.0 lb

## 2024-06-12 DIAGNOSIS — Z6832 Body mass index (BMI) 32.0-32.9, adult: Secondary | ICD-10-CM | POA: Diagnosis not present

## 2024-06-12 DIAGNOSIS — R0602 Shortness of breath: Secondary | ICD-10-CM | POA: Insufficient documentation

## 2024-06-12 DIAGNOSIS — K760 Fatty (change of) liver, not elsewhere classified: Secondary | ICD-10-CM | POA: Diagnosis not present

## 2024-06-12 DIAGNOSIS — R5383 Other fatigue: Secondary | ICD-10-CM

## 2024-06-12 DIAGNOSIS — R809 Proteinuria, unspecified: Secondary | ICD-10-CM | POA: Diagnosis not present

## 2024-06-12 DIAGNOSIS — Z1331 Encounter for screening for depression: Secondary | ICD-10-CM | POA: Insufficient documentation

## 2024-06-12 DIAGNOSIS — E66811 Obesity, class 1: Secondary | ICD-10-CM

## 2024-06-12 DIAGNOSIS — T733XXA Exhaustion due to excessive exertion, initial encounter: Secondary | ICD-10-CM

## 2024-06-12 DIAGNOSIS — Z8759 Personal history of other complications of pregnancy, childbirth and the puerperium: Secondary | ICD-10-CM

## 2024-06-12 NOTE — Progress Notes (Signed)
 [address] [place],  [place], [place] [address]  Office: [phone]  /  Fax: [phone]   Subjective   Initial Visit  Carolyn Wheeler (MR# [id number]) is a 33 y.o. female who presents for evaluation and treatment of obesity and related comorbidities. Current BMI is Body mass index is 33.11 kg/m. Carolyn Wheeler has been struggling with her weight for many years and has been unsuccessful in either losing weight, maintaining weight loss, or reaching her healthy weight goal.  [name] is currently in the action stage of change and ready to dedicate time achieving and maintaining a healthier weight. Carolyn Wheeler is interested in becoming our patient and working on intensive lifestyle modifications including (but not limited to) diet and exercise for weight loss.  Girl is 3 months postpartum from the birth of her third child, a boy.  She does have a son who is 24 and a daughter who is 6.  She never returned to prepregnancy weight with any of her pregnancies.  She does state that she first noticed weight gain approximately 1 year ago but does contribute it to eating out frequently.  Currently she is cooking at home because she is home on maternity leave they do go out to eat once a week on Fridays.  She does drink either sweet tea or's regular soda 2 times a day.  She does drink water but notes that it is not nearly enough.  She is currently exercising by walking 3 days a week for approximately 45 to 60 minutes. She had previous CT scan 11/03/2021 that did show some potential hepatic steatosis.  Last liver enzymes 02/25/2024 showed an elevated AST at 46. She did have some mild hydronephrosis noted with her last pregnancy and were following the protein in her urine.  Nephrology did believe that it was related to pregnancy.  She was to have a postpartum follow-up with the urologist but has not made that appointment. She also has a history of gestational hypertension, currently BPs are well-controlled without any  medication.  Weight history:  When asked how their weight has affected their life and health, she states: Contributed to orthopedic problems or mobility issues, Having fatigue, and Having poor endurance  When asked what else they would like to accomplish? She states: Adopt a healthier eating pattern and lifestyle, Improve energy levels and physical activity, Improve existing medical conditions, Improve quality of life, and goal weight of 140-150  She starting to note weight gain during : Over the last 12 months eating out more often- started prior to her last pregnancy- youngest child is 29 months old born 03/16/2024    Life events associated with weight gain include : pregnancy.   Other contributing factors: consumption of processed foods, moderate to high levels of stress, reduced physical activity, and need for convenient foods.  Their highest weight has been:  196 lbs.  Desired weight: 140-150  Previous weight-loss programs : Tracking and Journaling and Intermittent fasting.  Their maximum weight loss was:  6-10 lbs.  Their greatest challenge with dieting: no weight loss, felt restrictive, and meal preparation and cooking.  Current or previous pharmacotherapy: None.  Response to medication: Never tried medications   Nutritional History:  Current nutrition plan: Portion control / smart choices and Other: intermittent fasting.  How many times do you eat outside the home: 1-2 per week  How often do they skip meals: skips breakfast and skips lunch depends on the day  What beverages do they drink: water, regular soda , juice, and  sweet tea . Coffee with flavored creamer. 2 soda or sweet tea a day  Use of artificial sweetners : No  Food intolerances or dislikes: none.  Food triggers: Stress and To help comfort self.  Food cravings: Starches / Carbohydrates and Salty  Do they struggle with excessive hunger or portion control : Yes    Physical Activity:  Current level of  physical activity: Walking 60 minutes, three a week  Barriers to Exercise: time with newborn   Past medical history includes:   Past Medical History:  Diagnosis Date   Abnormal Pap smear of cervix 12/04/2013   [facility] - pos hpv   Heartburn    History of Papanicolaou smear of cervix 12/04/2013; [id number]   [facility] hpv +; neg -/-/-     Objective   BP 97/62   Pulse 60   Temp 98.3 F (36.8 C)   Ht 5' 2 (1.575 m)   Wt 181 lb (82.1 kg)   LMP 06/03/2024   SpO2 100%   BMI 33.11 kg/m  She was weighed on the bioimpedance scale: Body mass index is 33.11 kg/m.    Anthropometrics:  Vitals Temp: 98.3 F (36.8 C) BP: 97/62 Pulse Rate: 60 SpO2: 100 %   Anthropometric Measurements Height: 5' 2 (1.575 m) Weight: 181 lb (82.1 kg) BMI (Calculated): 33.1 Peak Weight: 196 lb Waist Measurement : 37 inches   Body Composition  Body Fat %: 39.9 % Fat Mass (lbs): 72.6 lbs Muscle Mass (lbs): 103.6 lbs Total Body Water (lbs): 74.4 lbs Visceral Fat Rating : 8   Other Clinical Data RMR: 1469 Fasting: yes Labs: yes Today's Visit #: 1 Starting Date: 06/12/24    Physical Exam:  General: She is overweight, cooperative, alert, well developed, and in no acute distress. PSYCH: Has normal mood, affect and thought process.   HEENT: EOMI, sclerae are anicteric. Lungs: Normal breathing effort, no conversational dyspnea. Extremities: No edema.  Neurologic: No gross sensory or motor deficits. No tremors or fasciculations noted.    Diagnostic Data Reviewed  EKG: Normal sinus rhythm, rate 64. No conduction abnormalities, abnormal Q waves or chamber enlargement.  Indirect Calorimeter completed today shows a VO2 of 213 and a REE of 1469.  Her calculated basal metabolic rate is 8466  thus her resting energy expenditure same as calculated.  Depression Screen  Ajanay's PHQ-9 score was:5 She denies symptoms affecting her life in any way at this time. PHQ2-9 Depression Screening    Little interest or pleasure in doing things Several days  Feeling down, depressed, or hopeless Not at all  PHQ-2 - Total Score 1  Trouble falling or staying asleep, or sleeping too much Several days  Feeling tired or having little energy Several days  Poor appetite or overeating  Several days  Feeling bad about yourself - or that you are a failure or have let yourself or your family down Several days  Trouble concentrating on things, such as reading the newspaper or watching television Not at all  Moving or speaking so slowly that other people could have noticed.  Or the opposite - being so fidgety or restless that you have been moving around a lot more than usual Not at all  Thoughts that you would be better off dead, or hurting yourself in some way Not at all  PHQ2-9 Total Score 5  If you checked off any problems, how difficult have these problems made it for you to do your work, take care of things at home, or get  along with other people    Depression Interventions/Treatment         Screening for Sleep Related Breathing Disorders  Jaylena admits to daytime somnolence and admits to waking up still tired. Patient has a history of symptoms of daytime fatigue. Ahliyah generally gets 7 hours of sleep per night, and states that she has nightime awakenings. Snoring is present. Apneic episodes are not present. Epworth Sleepiness Score is 11.   BMET    Component Value Date/Time   NA 135 02/25/2024 2001   NA 139 09/10/2023 1050   K 3.9 02/25/2024 2001   CL 106 02/25/2024 2001   CO2 19 (L) 02/25/2024 2001   GLUCOSE 77 02/25/2024 2001   BUN 11 02/25/2024 2001   BUN 8 09/10/2023 1050   CREATININE 0.53 02/25/2024 2001   CALCIUM 8.5 (L) 02/25/2024 2001   GFRNONAA >60 02/25/2024 2001   GFRAA >60 06/22/2018 0835   Lab Results  Component Value Date   HGBA1C 5.2 09/10/2023   HGBA1C 5.4 02/14/2021   No results found for: INSULIN  CBC    Component Value Date/Time   WBC 13.2 (H)  03/17/2024 0546   RBC 3.64 (L) 03/17/2024 0546   HGB 10.2 (L) 03/17/2024 0546   HGB 12.1 12/17/2023 1027   HCT 31.6 (L) 03/17/2024 0546   HCT 37.3 12/17/2023 1027   PLT 222 03/17/2024 0546   PLT 294 12/17/2023 1027   MCV 86.8 03/17/2024 0546   MCV 91 12/17/2023 1027   MCV 84 06/21/2014 0916   MCH 28.0 03/17/2024 0546   MCHC 32.3 03/17/2024 0546   RDW 15.8 (H) 03/17/2024 0546   RDW 13.3 12/17/2023 1027   RDW 15.9 (H) 06/21/2014 0916   Iron/TIBC/Ferritin/ %Sat No results found for: IRON, TIBC, FERRITIN, IRONPCTSAT Lipid Panel  No results found for: CHOL, TRIG, HDL, CHOLHDL, VLDL, LDLCALC, LDLDIRECT Hepatic Function Panel     Component Value Date/Time   PROT 6.1 (L) 02/25/2024 2001   PROT 6.6 09/10/2023 1050   ALBUMIN 2.7 (L) 02/25/2024 2001   ALBUMIN 3.8 (L) 09/10/2023 1050   AST 46 (H) 02/25/2024 2001   ALT 27 02/25/2024 2001   ALKPHOS 109 02/25/2024 2001   BILITOT 0.5 02/25/2024 2001   BILITOT 0.2 09/10/2023 1050   No results found for: TSH   Assessment and Plan   TREATMENT PLAN FOR OBESITY:  Recommended Dietary Goals  Zooey is currently in the action stage of change. As such, her goal is to implement medically supervised obesity management plan.  She has agreed to implement: the Category 1 plan - 1000 kcal per day  Behavioral Intervention  We discussed the following Behavioral Modification Strategies today: increasing lean protein intake to established goals, decreasing simple carbohydrates , increasing vegetables, increasing lower glycemic fruits, increasing fiber rich foods, avoiding skipping meals, increasing water intake, work on meal planning and preparation, reading food labels , keeping healthy foods at home, identifying sources and decreasing liquid calories, decreasing eating out or consumption of processed foods, and making healthy choices when eating convenient foods, planning for success, and better snacking  choices  Additional resources provided today: Handout on healthy eating and balanced plate, Handout on complex carbohydrates and lean sources of protein, Personalized instruction on the use of artificial intelligence for recipes, tailored meal plans, calorie tracking, and finding healthier options when eating out. , Category 1 packet, and Handout principles of weight management  Recommended Physical Activity Goals  Tilia has been advised to work up to 150 minutes of moderate  intensity aerobic activity a week and strengthening exercises 2-3 times per week for cardiovascular health, weight loss maintenance and preservation of muscle mass.   She has agreed to :  Continue current level of physical activity , Think about enjoyable ways to increase daily physical activity and overcoming barriers to exercise, and Increase physical activity in their day and reduce sedentary time (increase NEAT).  Medical Interventions and Pharmacotherapy We will work on building a Therapist, art and behavioral strategies. We will discuss the role of pharmacotherapy as an adjunct at subsequent visits.   ASSOCIATED CONDITIONS ADDRESSED TODAY  Other Fatigue  Shakia does feel that her weight is causing her energy to be lower than it should be. Fatigue may be related to obesity, depression or many other causes. Labs will be ordered, and in the meanwhile, Graciana will focus on self care including making healthy food choices, increasing physical activity and focusing on stress reduction.  Shortness of Breath Yemaya notes increasing shortness of breath with physical activity and seems to be worsening over time with weight gain. She notes getting out of breath sooner with activity than she used to. This has not gotten worse recently. Vaness denies shortness of breath at rest or orthopnea.  History of gestational hypertension -     CBC with Differential/Platelet -     Comprehensive metabolic  panel with GFR  Hepatic steatosis noted on CT 11/03/2021 -     Comprehensive metabolic panel with GFR -     Lipid panel  Class 1 obesity without serious comorbidity with body mass index (BMI) of 32.0 to 32.9 in adult, unspecified obesity type -     CBC with Differential/Platelet -     Comprehensive metabolic panel with GFR -     Hemoglobin A1c -     Insulin , random -     Lipid panel -     Magnesium -     TSH -     Vitamin B12 -     VITAMIN D  25 Hydroxy (Vit-D Deficiency, Fractures)  Fatigue due to excessive exertion, initial encounter -     EKG 12-Lead  Depression screening -     EKG 12-Lead  SOB (shortness of breath) on exertion  Proteinuria, unspecified type Will get CMP with GFR today if any elevations noted will plan to get urine with microalbumin at next visit.   Follow-up  She was informed of the importance of frequent follow-up visits to maximize her success with intensive lifestyle modifications for her multiple health conditions. She was informed we would discuss her lab results at her next visit unless there is a critical issue that needs to be addressed sooner. Gaylene agreed to keep her next visit at the agreed upon time to discuss these results.  Attestation Statement  This is the patient's intake visit at [facility]. The patient's Health Questionnaire was reviewed at length. Included in the packet: current and past health history, medications, allergies, ROS, gynecologic history (women only), surgical history, family history, social history, weight history, weight loss surgery history (for those that have had weight loss surgery), nutritional evaluation, mood and food questionnaire, PHQ9, Epworth questionnaire, sleep habits questionnaire, patient life and health improvement goals questionnaire. These will all be scanned into the patient's chart under media.   During the visit, I independently reviewed the patient's EKG, previous labs, bioimpedance  scale results, and indirect calorimetry results. I used this information to medically tailor a meal plan for the patient that will help  her to lose weight and will improve her obesity-related conditions. I performed a medically necessary appropriate examination and/or evaluation. I discussed the assessment and treatment plan with the patient. The patient was provided an opportunity to ask questions and all were answered. The patient agreed with the plan and demonstrated an understanding of the instructions. Labs were ordered at this visit and will be reviewed at the next visit unless critical results need to be addressed immediately. Clinical information was updated and documented in the EMR.   In addition, they received basic education on identification of processed foods and reduction of these, different sources of lean proteins and complex carbohydrates and how to eat balanced by incorporation of whole foods.  Reviewed by clinician on day of visit: allergies, medications, problem list, medical history, surgical history, family history, social history, and previous encounter notes.  I have spent 50 minutes in the care of the patient today including: 16 minutes before the visit reviewing and preparing the chart. 27 minutes face-to-face assessing and reviewing listed medical problems as outlined in obesity care plan, providing nutritional and behavioral counseling on topics outlined in the obesity care plan, independently interpreting test results and goals of care, as described in assessment and plan, reviewing and discussing biometric information and progress, and reviewing latest PCP notes and specialist consultations 7 minutes after the visit updating chart and documentation of encounter.       Ezreal Turay ANP-C

## 2024-06-13 LAB — COMPREHENSIVE METABOLIC PANEL WITH GFR
ALT: 47 IU/L — ABNORMAL HIGH (ref 0–32)
AST: 29 IU/L (ref 0–40)
Albumin: 4.2 g/dL (ref 3.9–4.9)
Alkaline Phosphatase: 100 IU/L (ref 44–121)
BUN/Creatinine Ratio: 23 (ref 9–23)
BUN: 13 mg/dL (ref 6–20)
Bilirubin Total: 0.3 mg/dL (ref 0.0–1.2)
CO2: 22 mmol/L (ref 20–29)
Calcium: 9.1 mg/dL (ref 8.7–10.2)
Chloride: 102 mmol/L (ref 96–106)
Creatinine, Ser: 0.56 mg/dL — ABNORMAL LOW (ref 0.57–1.00)
Globulin, Total: 2.9 g/dL (ref 1.5–4.5)
Glucose: 81 mg/dL (ref 70–99)
Potassium: 4.5 mmol/L (ref 3.5–5.2)
Sodium: 138 mmol/L (ref 134–144)
Total Protein: 7.1 g/dL (ref 6.0–8.5)
eGFR: 124 mL/min/1.73 (ref >59)

## 2024-06-13 LAB — HEMOGLOBIN A1C
Est. average glucose Bld gHb Est-mCnc: 97 mg/dL
Hgb A1c MFr Bld: 5 % (ref 4.8–5.6)

## 2024-06-13 LAB — TSH: TSH: 2 u[IU]/mL (ref 0.450–4.500)

## 2024-06-13 LAB — LIPID PANEL
Chol/HDL Ratio: 4.9 ratio — ABNORMAL HIGH (ref 0.0–4.4)
Cholesterol, Total: 210 mg/dL — ABNORMAL HIGH (ref 100–199)
HDL: 43 mg/dL (ref >39)
LDL Chol Calc (NIH): 150 mg/dL — ABNORMAL HIGH (ref 0–99)
Triglycerides: 92 mg/dL (ref 0–149)
VLDL Cholesterol Cal: 17 mg/dL (ref 5–40)

## 2024-06-13 LAB — CBC WITH DIFFERENTIAL/PLATELET
Basophils Absolute: 0.1 x10E3/uL (ref 0.0–0.2)
Basos: 1 %
EOS (ABSOLUTE): 0.2 x10E3/uL (ref 0.0–0.4)
Eos: 3 %
Hematocrit: 43.6 % (ref 34.0–46.6)
Hemoglobin: 13.6 g/dL (ref 11.1–15.9)
Immature Grans (Abs): 0 x10E3/uL (ref 0.0–0.1)
Immature Granulocytes: 0 %
Lymphocytes Absolute: 2.5 x10E3/uL (ref 0.7–3.1)
Lymphs: 33 %
MCH: 29.2 pg (ref 26.6–33.0)
MCHC: 31.2 g/dL — ABNORMAL LOW (ref 31.5–35.7)
MCV: 94 fL (ref 79–97)
Monocytes Absolute: 0.4 x10E3/uL (ref 0.1–0.9)
Monocytes: 5 %
Neutrophils Absolute: 4.4 x10E3/uL (ref 1.4–7.0)
Neutrophils: 58 %
Platelets: 400 x10E3/uL (ref 150–450)
RBC: 4.65 x10E6/uL (ref 3.77–5.28)
RDW: 13.8 % (ref 11.7–15.4)
WBC: 7.5 x10E3/uL (ref 3.4–10.8)

## 2024-06-13 LAB — VITAMIN B12: Vitamin B-12: 670 pg/mL (ref 232–1245)

## 2024-06-13 LAB — INSULIN, RANDOM: INSULIN: 12.6 u[IU]/mL (ref 2.6–24.9)

## 2024-06-13 LAB — VITAMIN D 25 HYDROXY (VIT D DEFICIENCY, FRACTURES): Vit D, 25-Hydroxy: 22.4 ng/mL — ABNORMAL LOW (ref 30.0–100.0)

## 2024-06-13 LAB — MAGNESIUM: Magnesium: 2 mg/dL (ref 1.6–2.3)

## 2024-06-16 ENCOUNTER — Ambulatory Visit (INDEPENDENT_AMBULATORY_CARE_PROVIDER_SITE_OTHER): Payer: Self-pay | Admitting: Nurse Practitioner

## 2024-06-26 ENCOUNTER — Ambulatory Visit (INDEPENDENT_AMBULATORY_CARE_PROVIDER_SITE_OTHER): Admitting: Nurse Practitioner

## 2024-06-26 ENCOUNTER — Encounter (INDEPENDENT_AMBULATORY_CARE_PROVIDER_SITE_OTHER): Payer: Self-pay | Admitting: Nurse Practitioner

## 2024-06-26 VITALS — BP 120/82 | HR 70 | Temp 98.7°F | Ht 62.0 in | Wt 182.0 lb

## 2024-06-26 DIAGNOSIS — Z6832 Body mass index (BMI) 32.0-32.9, adult: Secondary | ICD-10-CM

## 2024-06-26 DIAGNOSIS — E88819 Insulin resistance, unspecified: Secondary | ICD-10-CM | POA: Diagnosis not present

## 2024-06-26 DIAGNOSIS — K760 Fatty (change of) liver, not elsewhere classified: Secondary | ICD-10-CM | POA: Diagnosis not present

## 2024-06-26 DIAGNOSIS — E66811 Obesity, class 1: Secondary | ICD-10-CM

## 2024-06-26 DIAGNOSIS — E782 Mixed hyperlipidemia: Secondary | ICD-10-CM

## 2024-06-26 DIAGNOSIS — R748 Abnormal levels of other serum enzymes: Secondary | ICD-10-CM | POA: Diagnosis not present

## 2024-06-26 DIAGNOSIS — Z8759 Personal history of other complications of pregnancy, childbirth and the puerperium: Secondary | ICD-10-CM

## 2024-06-26 DIAGNOSIS — E559 Vitamin D deficiency, unspecified: Secondary | ICD-10-CM

## 2024-06-26 MED ORDER — VITAMIN D (ERGOCALCIFEROL) 1.25 MG (50000 UNIT) PO CAPS: 50000.0000 [IU] | ORAL_CAPSULE | ORAL | 1 refills | Status: DC

## 2024-06-26 NOTE — Progress Notes (Signed)
 Office: [phone]  /  Fax: [phone]  WEIGHT SUMMARY AND BIOMETRICS  Weight Lost Since Last Visit: 0lb  Weight Gained Since Last Visit: 1lb   Vitals Temp: 98.7 F (37.1 C) BP: 120/82 Pulse Rate: 70 SpO2: 98 %   Anthropometric Measurements Height: 5' 2 (1.575 m) Weight: 182 lb (82.6 kg) BMI (Calculated): 33.28 Weight at Last Visit: 181lb Weight Lost Since Last Visit: 0lb Weight Gained Since Last Visit: 1lb Starting Weight: 181lb Total Weight Loss (lbs): 0 lb (0 kg) Peak Weight: 196lb Waist Measurement : 37 inches   Body Composition  Body Fat %: 39.7 % Fat Mass (lbs): 72.4 lbs Muscle Mass (lbs): 104.4 lbs Total Body Water (lbs): 74.2 lbs Visceral Fat Rating : 8   Other Clinical Data RMR: 1469 Fasting: No Labs: no Today's Visit #: 2 Starting Date: 06/12/24     HPI  Chief Complaint: OBESITY  Oma is here to discuss her progress with her obesity treatment plan. She is on the the Category 1 Plan and states she is following her eating plan approximately 70 % of the time. She states she is exercising 30 minutes 2 days per week.   Interval History:  Since last office visit she was on vacation for 8 days. She started the plan 5 days ago . She is tracking her input on her app.  She is doing well but has been having sweets cravings at night. She cut coffee out cold [place] and has been having associated headaches. She has been incorporating walking for 30 minutes 2 days a week.   Breakfast-keto bread 2 toast and 2 scrambled eggs and 1/2 avocado Lunch: salad with chicken and low calorie dressing Snack : Breakfast protein bar Dinner: chicken breast and broccoli.     PHYSICAL EXAM:  Blood pressure 120/82, pulse 70, temperature 98.7 F (37.1 C), height 5' 2 (1.575 m), weight 182 lb (82.6 kg), last menstrual period 06/03/2024, SpO2 98%. Body mass index is 33.29 kg/m.  General: She is overweight, cooperative, alert, well developed, and in no acute  distress. PSYCH: Has normal mood, affect and thought process.   Extremities: No edema.  Neurologic: No gross sensory or motor deficits. No tremors or fasciculations noted.    DIAGNOSTIC DATA REVIEWED:  BMET    Component Value Date/Time   NA 138 06/12/2024 1254   K 4.5 06/12/2024 1254   CL 102 06/12/2024 1254   CO2 22 06/12/2024 1254   GLUCOSE 81 06/12/2024 1254   GLUCOSE 77 02/25/2024 2001   BUN 13 06/12/2024 1254   CREATININE 0.56 (L) 06/12/2024 1254   CALCIUM 9.1 06/12/2024 1254   GFRNONAA >60 02/25/2024 2001   GFRAA >60 06/22/2018 0835   Lab Results  Component Value Date   HGBA1C 5.0 06/12/2024   HGBA1C 5.4 02/14/2021   Lab Results  Component Value Date   INSULIN  12.6 06/12/2024   Lab Results  Component Value Date   TSH 2.000 06/12/2024   CBC    Component Value Date/Time   WBC 7.5 06/12/2024 1254   WBC 13.2 (H) 03/17/2024 0546   RBC 4.65 06/12/2024 1254   RBC 3.64 (L) 03/17/2024 0546   HGB 13.6 06/12/2024 1254   HCT 43.6 06/12/2024 1254   PLT 400 06/12/2024 1254   MCV 94 06/12/2024 1254   MCV 84 06/21/2014 0916   MCH 29.2 06/12/2024 1254   MCH 28.0 03/17/2024 0546   MCHC 31.2 (L) 06/12/2024 1254   MCHC 32.3 03/17/2024 0546   RDW 13.8 06/12/2024 1254  RDW 15.9 (H) 06/21/2014 9083   Iron Studies No results found for: IRON, TIBC, FERRITIN, IRONPCTSAT Lipid Panel - Dyslipidemia- low HDL and elevated LDL. Counseled on importance of following meal plan and weight loss. Reviewed labels with her to start limiting saturated fats.     Component Value Date/Time   CHOL 210 (H) 06/12/2024 1254   TRIG 92 06/12/2024 1254   HDL 43 06/12/2024 1254   CHOLHDL 4.9 (H) 06/12/2024 1254   LDLCALC 150 (H) 06/12/2024 1254   Hepatic Function Panel - elevated liver functions discussed with patient. Stressed importance of following meal plan and limiting simple carbs. Will recheck in 3 months.    Component Value Date/Time   PROT 7.1 06/12/2024 1254   ALBUMIN 4.2  06/12/2024 1254   AST 29 06/12/2024 1254   ALT 47 (H) 06/12/2024 1254   ALKPHOS 100 06/12/2024 1254   BILITOT 0.3 06/12/2024 1254      Component Value Date/Time   TSH 2.000 06/12/2024 1254   Nutritional- Vitamin D  Deficiency. Begin Ergocalciferol  50000 units once a week and recheck lab in 3 months Lab Results  Component Value Date   VD25OH 22.4 (L) 06/12/2024   HOMA-IR 2.52- insulin  resistance  ASSESSMENT AND PLAN  TREATMENT PLAN FOR OBESITY:  Recommended Dietary Goals  Destanee is currently in the action stage of change. As such, her goal is to continue weight management plan. She has agreed to the Category 2 Plan.  Behavioral Intervention  We discussed the following Behavioral Modification Strategies today: increasing lean protein intake to established goals, increasing water intake , work on tracking and journaling calories using tracking application, reading food labels , better snacking choices, continue to work on maintaining a reduced calorie state, getting the recommended amount of protein, incorporating whole foods, making healthy choices, staying well hydrated and practicing mindfulness when eating., and increase protein intake, fibrous foods (25 grams per day for women, 30 grams for men) and water to improve satiety and decrease hunger signals. .  Additional resources provided today: NA  Recommended Physical Activity Goals  Joanmarie has been advised to work up to 150 minutes of moderate intensity aerobic activity a week and strengthening exercises 2-3 times per week for cardiovascular health, weight loss maintenance and preservation of muscle mass.   She has agreed to Think about enjoyable ways to increase daily physical activity and overcoming barriers to exercise, Increase physical activity in their day and reduce sedentary time (increase NEAT)., and Start aerobic activity with a goal of 150 minutes a week at moderate intensity.    Pharmacotherapy We discussed  various medication options to help [facility] with her weight loss efforts and we both agreed to hold medication at this time.  If weight loss is still low at next visit will start on Metformin for insulin  resistance , pt is agreeable. .  ASSOCIATED CONDITIONS ADDRESSED TODAY  Action/Plan  History of gestational hypertension Insulin  resistance HOMA-IR score 2.52 05/2024 Continue to follow Category 1 meal plan Limit simple carbs Increase activity to 180 minutes/week If weight loss is still slow at next visit discussed adding Metformin Counseled that losing 10-15% of body weight can reduce glucose levels and insulin  resistance  Hepatic steatosis noted on CT 11/03/2021 Elevated liver enzymes ALT mildly elevated Counseled that losing 10-15% of body weight can improve liver functions Increase activity to 180 minutes/week Will recheck in 3 months  Mixed hyperlipidemia Dyslipidemia- low HDL and elevated LDL. Counseled on importance of following meal plan and weight loss. Reviewed labels  with her to start limiting saturated fats.  Counseled that losing 10-15% of body weight can improve lipids   Vitamin D  deficiency Begin Ergocalciferol  50000 units once a week and recheck lab in 3 months -     Vitamin D  (Ergocalciferol ); Take 1 capsule (50,000 Units total) by mouth every 7 (seven) days.  Dispense: 5 capsule; Refill: 1   Class 1 obesity without serious comorbidity with body mass index (BMI) of 32.0 to 32.9 in adult, unspecified obesity type Continue Category 1 meal plan Add coffee back and account for creamer from snack calories Decrease afternoon snack calories as currently at 200 Continue to push water Increase activity to 180 minutes/week If weight loss is still low at next visit will add Metformin XR 500 mg 1 tab po every day       Return in about 2 weeks (around 07/10/2024).SABRA She was informed of the importance of frequent follow up visits to maximize her success with intensive  lifestyle modifications for her multiple health conditions.   ATTESTASTION STATEMENTS:  Reviewed by clinician on day of visit: allergies, medications, problem list, medical history, surgical history, family history, social history, and previous encounter notes.     Weiland Tomich ANP-C

## 2024-07-10 ENCOUNTER — Ambulatory Visit: Admitting: Nurse Practitioner

## 2024-07-14 ENCOUNTER — Other Ambulatory Visit (INDEPENDENT_AMBULATORY_CARE_PROVIDER_SITE_OTHER): Payer: Self-pay | Admitting: Nurse Practitioner

## 2024-07-14 ENCOUNTER — Ambulatory Visit (INDEPENDENT_AMBULATORY_CARE_PROVIDER_SITE_OTHER): Admitting: Nurse Practitioner

## 2024-07-14 ENCOUNTER — Encounter (INDEPENDENT_AMBULATORY_CARE_PROVIDER_SITE_OTHER): Payer: Self-pay | Admitting: Nurse Practitioner

## 2024-07-14 VITALS — BP 99/66 | HR 71 | Temp 98.0°F | Ht 62.0 in | Wt 182.0 lb

## 2024-07-14 DIAGNOSIS — E88819 Insulin resistance, unspecified: Secondary | ICD-10-CM | POA: Diagnosis not present

## 2024-07-14 DIAGNOSIS — E782 Mixed hyperlipidemia: Secondary | ICD-10-CM

## 2024-07-14 DIAGNOSIS — E66811 Obesity, class 1: Secondary | ICD-10-CM

## 2024-07-14 DIAGNOSIS — Z6833 Body mass index (BMI) 33.0-33.9, adult: Secondary | ICD-10-CM

## 2024-07-14 DIAGNOSIS — K760 Fatty (change of) liver, not elsewhere classified: Secondary | ICD-10-CM

## 2024-07-14 DIAGNOSIS — E559 Vitamin D deficiency, unspecified: Secondary | ICD-10-CM

## 2024-07-14 MED ORDER — WEGOVY 0.25 MG/0.5ML ~~LOC~~ SOAJ: 0.2500 mg | SUBCUTANEOUS | 0 refills | Status: DC

## 2024-07-14 NOTE — Progress Notes (Signed)
 Office: [phone]  /  Fax: [phone]  WEIGHT SUMMARY AND BIOMETRICS  Weight Lost Since Last Visit: 0lb  Weight Gained Since Last Visit: 0lb   Vitals Temp: 98 F (36.7 C) BP: 99/66 Pulse Rate: 71 SpO2: 98 %   Anthropometric Measurements Height: 5' 2 (1.575 m) Weight: 182 lb (82.6 kg) BMI (Calculated): 33.28 Weight at Last Visit: 181lb Weight Lost Since Last Visit: 0lb Weight Gained Since Last Visit: 0lb Starting Weight: 181lb Total Weight Loss (lbs): 0 lb (0 kg) Peak Weight: 196lb Waist Measurement : 37 inches   Body Composition  Body Fat %: 39.1 % Fat Mass (lbs): 71.4 lbs Muscle Mass (lbs): 105.6 lbs Total Body Water (lbs): 77 lbs Visceral Fat Rating : 8   Other Clinical Data RMR: 1469 Fasting: No Labs: No Today's Visit #: 3 Starting Date: 06/12/24    Total Weight Loss:0 Percent of body weight lost:0  Bio Impedence Data: Muscle up 1.2 pounds and adipose down 1 pound.   HPI  Chief Complaint: OBESITY  Carolyn Wheeler is here to discuss her progress with her obesity treatment plan. She is on the the Category 1 Plan and states she is following her eating plan approximately 70 % of the time. She states she is exercising 45-60 minutes 7 days per week.   Interval History:  Since last office visit she has been getting 2 premier protein shakes to make sure she is getting 80 grams of protein. She has gone over in calories by 100-300 calories a day.  She is staying hungry. Having a lot of difficulty limiting snacking due to hunger.  She is having issues with constipation, she has been going daily but stool is very hard. Has not tried any over the counter medications.  She has been walking 45-60 minutes 7 days a week.  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 She continues to limit saturated fats and simple carbs for hyperlipidemia and insulin  resistance. She continues on Ergocalciferol  50000 units once a week for Vit D deficiency.  History of hepatic steatosis- continues to work on nutrition plan- category 1 and exercise with goal of weight loss to improve hepatic steatosis. She is very interested in GLP1 for weight loss and does believe her insurance does cover Wegovy .     PHYSICAL EXAM:  Blood pressure 99/66, pulse 71, temperature 98 F (36.7 C), height 5' 2 (1.575 m), weight 182 lb (82.6 kg), SpO2 98%. Body mass index is 33.29 kg/m.  General: Well Developed, well nourished, and in no acute distress.  HEENT: Normocephalic, atraumatic; EOMI, sclerae are anicteric. Skin: Warm and dry, good turgor Chest:  Normal excursion, shape, no gross ABN Respiratory: No conversational dyspnea; speaking in full sentences NeuroM-Sk:  Normal gross ROM all 4 extremities  Psych: A and O X 3, insight adequate, mood- full    DIAGNOSTIC DATA REVIEWED:  BMET    Component Value Date/Time   NA 138 06/12/2024 1254   K 4.5 06/12/2024 1254   CL 102 06/12/2024 1254   CO2 22 06/12/2024 1254   GLUCOSE 81 06/12/2024 1254   GLUCOSE 77 02/25/2024 2001   BUN 13 06/12/2024 1254   CREATININE 0.56 (L) 06/12/2024 1254   CALCIUM 9.1 06/12/2024 1254   GFRNONAA >60 02/25/2024 2001   GFRAA >60 06/22/2018 0835   Lab Results  Component Value Date   HGBA1C 5.0 06/12/2024   HGBA1C 5.4 02/14/2021   Lab Results  Component Value Date   INSULIN  12.6  06/12/2024   Lab Results  Component Value Date   TSH 2.000 06/12/2024   CBC    Component Value Date/Time   WBC 7.5 06/12/2024 1254   WBC 13.2 (H) 03/17/2024 0546   RBC 4.65 06/12/2024 1254   RBC 3.64 (L) 03/17/2024 0546   HGB 13.6 06/12/2024 1254   HCT 43.6 06/12/2024 1254   PLT 400 06/12/2024 1254   MCV 94 06/12/2024 1254   MCV 84 06/21/2014 0916   MCH 29.2 06/12/2024 1254   MCH 28.0 03/17/2024 0546   MCHC 31.2 (L) 06/12/2024 1254   MCHC 32.3 03/17/2024 0546   RDW 13.8 06/12/2024 1254   RDW 15.9 (H) 06/21/2014 0916   Iron Studies No results found for: IRON, TIBC,  FERRITIN, IRONPCTSAT Lipid Panel     Component Value Date/Time   CHOL 210 (H) 06/12/2024 1254   TRIG 92 06/12/2024 1254   HDL 43 06/12/2024 1254   CHOLHDL 4.9 (H) 06/12/2024 1254   LDLCALC 150 (H) 06/12/2024 1254   Hepatic Function Panel     Component Value Date/Time   PROT 7.1 06/12/2024 1254   ALBUMIN 4.2 06/12/2024 1254   AST 29 06/12/2024 1254   ALT 47 (H) 06/12/2024 1254   ALKPHOS 100 06/12/2024 1254   BILITOT 0.3 06/12/2024 1254      Component Value Date/Time   TSH 2.000 06/12/2024 1254   Nutritional Lab Results  Component Value Date   VD25OH 22.4 (L) 06/12/2024     ASSESSMENT AND PLAN  TREATMENT PLAN FOR OBESITY: Class 1 obesity without serious comorbidity with body mass index (BMI) of 32.0 to 32.9 in adult, unspecified obesity type -     Wegovy ; Inject 0.25 mg into the skin once a week.  Dispense: 2 mL; Refill: 0 Recommended Dietary Goals  Carolyn Wheeler is currently in the action stage of change. As such, her goal is to continue weight management plan. She has agreed to the Category 1 Plan.  Behavioral Intervention  We discussed the following Behavioral Modification Strategies today: continue to work on maintaining a reduced calorie state, getting the recommended amount of protein, incorporating whole foods, making healthy choices, staying well hydrated and practicing mindfulness when eating. and increase protein intake, fibrous foods (25 grams per day for women, 30 grams for men) and water to improve satiety and decrease hunger signals. .   Recommended Physical Activity Goals  Carolyn Wheeler has been advised to work up to 150 minutes of moderate intensity aerobic activity a week and strengthening exercises 2-3 times per week for cardiovascular health, weight loss maintenance and preservation of muscle mass.   She has agreed to Increase physical activity in their day and reduce sedentary time (increase NEAT)., Increase volume of physical activity to a goal of 240  minutes a week, and Combine aerobic and strengthening exercises for efficiency and improved cardiometabolic health.   Pharmacotherapy We discussed various medication options to help Carolyn Wheeler with her weight loss efforts and we both agreed to start Wegovy  0.25 mg SQ QW- script sent to pharmacy.  She will call insurance to verify coverage. If insurance does not cover she is to send me a message and will start Metfromin 500 mg every day for insulin  resistance. She is counseled in depth on use of Wegovy , mechanism of action of medication and use of injector. All questions answered.  ASSOCIATED CONDITIONS ADDRESSED TODAY  Action/Plan  Hepatic steatosis noted on CT 11/03/2021 Continue Category 1 meal plan- limit saturated fats and simple carbs Continue cardio exercise daily Continue  focusing on weight loss plan  Mixed hyperlipidemia Continue Category 1 meal plan- limit saturated fats and simple carbs Continue cardio exercise daily Continue focusing on weight loss plan  Vitamin D  deficiency Continue ergocalciferol  50000 units once a week will recheck level in November  Insulin  resistance HOMA-IR score 2.52 05/2024 Initiate Wegovy  0.25 mg SQ QW If Wegovy  is not covered notify office and will start Metformin 500 mg 1 tab daily           Return in about 4 weeks (around 08/11/2024).Carolyn Wheeler She was informed of the importance of frequent follow up visits to maximize her success with intensive lifestyle modifications for her multiple health conditions.   ATTESTASTION STATEMENTS:  Reviewed by clinician on day of visit: allergies, medications, problem list, medical history, surgical history, family history, social history, and previous encounter notes.     Carolyn Wheeler ANP-C

## 2024-07-29 ENCOUNTER — Encounter: Payer: Self-pay | Admitting: Licensed Practical Nurse

## 2024-07-29 ENCOUNTER — Ambulatory Visit (INDEPENDENT_AMBULATORY_CARE_PROVIDER_SITE_OTHER): Admitting: Nurse Practitioner

## 2024-08-11 ENCOUNTER — Ambulatory Visit (INDEPENDENT_AMBULATORY_CARE_PROVIDER_SITE_OTHER): Admitting: Nurse Practitioner

## 2024-08-11 ENCOUNTER — Encounter (INDEPENDENT_AMBULATORY_CARE_PROVIDER_SITE_OTHER): Payer: Self-pay | Admitting: Nurse Practitioner

## 2024-08-11 ENCOUNTER — Other Ambulatory Visit (INDEPENDENT_AMBULATORY_CARE_PROVIDER_SITE_OTHER): Payer: Self-pay | Admitting: Nurse Practitioner

## 2024-08-11 VITALS — BP 102/68 | HR 70 | Temp 98.3°F | Ht 62.0 in | Wt 179.0 lb

## 2024-08-11 DIAGNOSIS — Z6832 Body mass index (BMI) 32.0-32.9, adult: Secondary | ICD-10-CM

## 2024-08-11 DIAGNOSIS — E66811 Obesity, class 1: Secondary | ICD-10-CM | POA: Diagnosis not present

## 2024-08-11 DIAGNOSIS — E88819 Insulin resistance, unspecified: Secondary | ICD-10-CM | POA: Diagnosis not present

## 2024-08-11 DIAGNOSIS — E559 Vitamin D deficiency, unspecified: Secondary | ICD-10-CM

## 2024-08-11 DIAGNOSIS — K760 Fatty (change of) liver, not elsewhere classified: Secondary | ICD-10-CM

## 2024-08-11 MED ORDER — WEGOVY 0.5 MG/0.5ML ~~LOC~~ SOAJ: 0.5000 mg | SUBCUTANEOUS | 0 refills | Status: AC

## 2024-08-11 MED ORDER — VITAMIN D (ERGOCALCIFEROL) 1.25 MG (50000 UNIT) PO CAPS
50000.0000 [IU] | ORAL_CAPSULE | ORAL | 1 refills | Status: AC
Start: 2024-08-11 — End: ?

## 2024-08-11 NOTE — Progress Notes (Signed)
 Office: [phone]  /  Fax: [phone]  WEIGHT SUMMARY AND BIOMETRICS  Weight Lost Since Last Visit: 3 lb  Weight Gained Since Last Visit: 0   Vitals Temp: 98.3 F (36.8 C) BP: 102/68 Pulse Rate: 70 SpO2: 98 %   Anthropometric Measurements Height: 5' 2 (1.575 m) Weight: 179 lb (81.2 kg) BMI (Calculated): 32.73 Weight at Last Visit: 182 lb Weight Lost Since Last Visit: 3 lb Weight Gained Since Last Visit: 0 Starting Weight: 181 lb Total Weight Loss (lbs): 2 lb (0.907 kg) Peak Weight: 196 lb Waist Measurement : 37 inches   Body Composition  Body Fat %: 39.4 % Fat Mass (lbs): 70.6 lbs Muscle Mass (lbs): 103 lbs Total Body Water (lbs): 75 lbs Visceral Fat Rating : 8   Other Clinical Data RMR: 1469 Fasting: no Labs: no Today's Visit #: 4 Starting Date: 06/12/24    Total Weight Loss: 2 pounds Bio Impedance Data reviewed with patient: Down 2.6 pounds muscle, down 0.8 pounds adipose  HPI  Chief Complaint: OBESITY  Carolyn Wheeler is here to discuss her progress with her obesity treatment plan. She is on the the Category 1 Plan and states she is following her eating plan approximately 70 % of the time. She states she is exercising 30 minutes 3 days per week.   Interval History:  Since last office visit she has been on Wegovy  0.25 mg SQ QW. She is still feeling hungry, not noticing a decrease in appetite as much except the first shot.  She has been having more constipation. She has been using prunes which is helping.  She is eating Chicken and broccoli and does protein shakes.  She has not been skipping meals.  She has been drinking more water- approximately 80 ounces a day. She has been walking on her walking pad at home 3 days a week for 30 minutes.  She has started back to work and has been trying to pack her lunch including high protein   Pharmacotherapy for weight loss: She is currently taking Wegovy  0.25 mg SQ QW for medical weight loss.  Denies side effects  of throat swelling, difficulty swallowing, change in voice, severe constipation, nausea/vomiting    PHYSICAL EXAM:  Blood pressure 102/68, pulse 70, temperature 98.3 F (36.8 C), height 5' 2 (1.575 m), weight 179 lb (81.2 kg), SpO2 98%. Body mass index is 32.74 kg/m.  General: Well Developed, well nourished, and in no acute distress.  HEENT: Normocephalic, atraumatic; EOMI, sclerae are anicteric. Skin: Warm and dry, good turgor Chest:  Normal excursion, shape, no gross ABN Respiratory: No conversational dyspnea; speaking in full sentences NeuroM-Sk:  Normal gross ROM * 4 extremities  Psych: A and O X 3, insight adequate, mood- full    DIAGNOSTIC DATA REVIEWED:  Last metabolic panel Lab Results  Component Value Date   GLUCOSE 81 06/12/2024   NA 138 06/12/2024   K 4.5 06/12/2024   CL 102 06/12/2024   CO2 22 06/12/2024   BUN 13 06/12/2024   CREATININE 0.56 (L) 06/12/2024   EGFR 124 06/12/2024   CALCIUM 9.1 06/12/2024   PROT 7.1 06/12/2024   ALBUMIN 4.2 06/12/2024   LABGLOB 2.9 06/12/2024   AGRATIO 1.5 02/14/2021   BILITOT 0.3 06/12/2024   ALKPHOS 100 06/12/2024   AST 29 06/12/2024   ALT 47 (H) 06/12/2024   ANIONGAP 10 02/25/2024     Lab Results  Component Value Date   HGBA1C 5.0 06/12/2024   HGBA1C 5.4 02/14/2021   Lab Results  Component Value Date   INSULIN  12.6 06/12/2024   Lab Results  Component Value Date   TSH 2.000 06/12/2024   CBC    Component Value Date/Time   WBC 7.5 06/12/2024 1254   WBC 13.2 (H) 03/17/2024 0546   RBC 4.65 06/12/2024 1254   RBC 3.64 (L) 03/17/2024 0546   HGB 13.6 06/12/2024 1254   HCT 43.6 06/12/2024 1254   PLT 400 06/12/2024 1254   MCV 94 06/12/2024 1254   MCV 84 06/21/2014 0916   MCH 29.2 06/12/2024 1254   MCH 28.0 03/17/2024 0546   MCHC 31.2 (L) 06/12/2024 1254   MCHC 32.3 03/17/2024 0546   RDW 13.8 06/12/2024 1254   RDW 15.9 (H) 06/21/2014 0916    Lipid Panel     Component Value Date/Time   CHOL 210 (H)  06/12/2024 1254   TRIG 92 06/12/2024 1254   HDL 43 06/12/2024 1254   CHOLHDL 4.9 (H) 06/12/2024 1254   LDLCALC 150 (H) 06/12/2024 1254    Nutritional Lab Results  Component Value Date   VD25OH 22.4 (L) 06/12/2024   Lab Results  Component Value Date   VITAMINB12 670 06/12/2024     ASSESSMENT AND PLAN  TREATMENT PLAN FOR OBESITY:  Recommended Dietary Goals  Loyal is currently in the action stage of change. As such, her goal is to continue weight management plan. She has agreed to the Category 1 Plan.  Behavioral Intervention  We discussed the following Behavioral Modification Strategies today: continue to work on maintaining a reduced calorie state, getting the recommended amount of protein, incorporating whole foods, making healthy choices, staying well hydrated and practicing mindfulness when eating. and increase protein intake, fibrous foods (25 grams per day for women, 30 grams for men) and water to improve satiety and decrease hunger signals. .  Additional resources provided today: NA  Recommended Physical Activity Goals  Rennee has been advised to work up to 150 minutes of moderate intensity aerobic activity a week and strengthening exercises 2-3 times per week for cardiovascular health, weight loss maintenance and preservation of muscle mass.   She has agreed to Think about enjoyable ways to increase daily physical activity and overcoming barriers to exercise, Increase physical activity in their day and reduce sedentary time (increase NEAT)., Increase volume of physical activity to a goal of 240 minutes a week, and Combine aerobic and strengthening exercises for efficiency and improved cardiometabolic health.   Pharmacotherapy We discussed various medication options to help [facility] with her weight loss efforts and we both agreed to increase Wegovy  to 0.5 mg SQ QW.  ASSOCIATED CONDITIONS ADDRESSED TODAY  Action/Plan  Vitamin D  deficiency Supplement with  Ergocalciferol  50000 units once a week for 12 weeks and then recheck level In November  -     Vitamin D  (Ergocalciferol ); Take 1 capsule (50,000 Units total) by mouth every 7 (seven) days.  Dispense: 5 capsule; Refill: 1  Hepatic steatosis noted on CT 11/03/2021       Continue to work on nutrition and exercise for weight loss       Increase Wegovy  to 0.5 mg SQ QW  Insulin  resistance HOMA-IR score 2.52 05/2024 Continue Category 1  meal plan, limit simple carbohydrates Decreasing body weight by 10-15% can improve glucose levels Encourage increasing aerobic activity to 150 minutes a week and incorporating strength training.   Class 1 obesity without serious comorbidity with body mass index (BMI) of 32.0 to 32.9 in adult, unspecified obesity type See plan above -  Wegovy ; Inject 0.5 mg into the skin once a week.  Dispense: 2 mL; Refill: 0         Return in about 4 weeks (around 09/08/2024).SABRA She was informed of the importance of frequent follow up visits to maximize her success with intensive lifestyle modifications for her multiple health conditions.   ATTESTASTION STATEMENTS:  Reviewed by clinician on day of visit: allergies, medications, problem list, medical history, surgical history, family history, social history, and previous encounter notes.     Mylin Gignac ANP-C

## 2024-09-09 ENCOUNTER — Ambulatory Visit (INDEPENDENT_AMBULATORY_CARE_PROVIDER_SITE_OTHER): Payer: Self-pay | Admitting: Nurse Practitioner

## 2024-11-13 ENCOUNTER — Other Ambulatory Visit (INDEPENDENT_AMBULATORY_CARE_PROVIDER_SITE_OTHER): Payer: Self-pay | Admitting: Nurse Practitioner

## 2024-11-13 DIAGNOSIS — E559 Vitamin D deficiency, unspecified: Secondary | ICD-10-CM
